# Patient Record
Sex: Female | Born: 1975 | Race: White | Hispanic: No | Marital: Married | Smoking: Former smoker
Health system: Southern US, Community
[De-identification: ages and names within clinical notes are randomized; demographics above are authoritative.]

## PROBLEM LIST (undated history)

## (undated) DIAGNOSIS — IMO0002 Reserved for concepts with insufficient information to code with codable children: Secondary | ICD-10-CM

## (undated) DIAGNOSIS — K6389 Other specified diseases of intestine: Secondary | ICD-10-CM

## (undated) DIAGNOSIS — J189 Pneumonia, unspecified organism: Secondary | ICD-10-CM

## (undated) DIAGNOSIS — E785 Hyperlipidemia, unspecified: Secondary | ICD-10-CM

## (undated) DIAGNOSIS — F419 Anxiety disorder, unspecified: Secondary | ICD-10-CM

## (undated) DIAGNOSIS — R140 Abdominal distension (gaseous): Principal | ICD-10-CM

## (undated) DIAGNOSIS — I10 Essential (primary) hypertension: Secondary | ICD-10-CM

## (undated) DIAGNOSIS — K219 Gastro-esophageal reflux disease without esophagitis: Secondary | ICD-10-CM

## (undated) DIAGNOSIS — R112 Nausea with vomiting, unspecified: Secondary | ICD-10-CM

## (undated) DIAGNOSIS — Z9889 Other specified postprocedural states: Secondary | ICD-10-CM

## (undated) HISTORY — PX: CHOLECYSTECTOMY: SHX55

## (undated) HISTORY — PX: HERNIA REPAIR: SHX51

## (undated) HISTORY — PX: TUBAL LIGATION: SHX77

## (undated) HISTORY — DX: Abdominal distension (gaseous): R14.0

## (undated) HISTORY — DX: Other specified diseases of intestine: K63.89

## (undated) HISTORY — DX: Hyperlipidemia, unspecified: E78.5

---

## 1998-07-21 ENCOUNTER — Inpatient Hospital Stay (HOSPITAL_COMMUNITY): Admission: AD | Admit: 1998-07-21 | Discharge: 1998-07-27 | Payer: Self-pay | Admitting: Obstetrics and Gynecology

## 1998-07-22 ENCOUNTER — Encounter: Payer: Self-pay | Admitting: Obstetrics and Gynecology

## 1998-07-29 ENCOUNTER — Encounter (HOSPITAL_COMMUNITY): Admission: RE | Admit: 1998-07-29 | Discharge: 1998-10-27 | Payer: Self-pay | Admitting: *Deleted

## 1998-08-20 ENCOUNTER — Other Ambulatory Visit: Admission: RE | Admit: 1998-08-20 | Discharge: 1998-08-20 | Payer: Self-pay | Admitting: Obstetrics and Gynecology

## 1999-11-08 ENCOUNTER — Inpatient Hospital Stay (HOSPITAL_COMMUNITY): Admission: AD | Admit: 1999-11-08 | Discharge: 1999-11-11 | Payer: Self-pay | Admitting: Obstetrics and Gynecology

## 1999-11-08 ENCOUNTER — Encounter (INDEPENDENT_AMBULATORY_CARE_PROVIDER_SITE_OTHER): Payer: Self-pay

## 1999-12-13 ENCOUNTER — Other Ambulatory Visit: Admission: RE | Admit: 1999-12-13 | Discharge: 1999-12-13 | Payer: Self-pay | Admitting: Obstetrics and Gynecology

## 2001-04-17 ENCOUNTER — Other Ambulatory Visit: Admission: RE | Admit: 2001-04-17 | Discharge: 2001-04-17 | Payer: Self-pay | Admitting: Obstetrics and Gynecology

## 2010-05-09 DIAGNOSIS — K6389 Other specified diseases of intestine: Secondary | ICD-10-CM

## 2010-05-09 DIAGNOSIS — R140 Abdominal distension (gaseous): Secondary | ICD-10-CM

## 2010-05-09 HISTORY — DX: Other specified diseases of intestine: K63.89

## 2010-05-09 HISTORY — DX: Abdominal distension (gaseous): R14.0

## 2010-05-19 ENCOUNTER — Other Ambulatory Visit
Admission: RE | Admit: 2010-05-19 | Discharge: 2010-05-19 | Payer: Self-pay | Source: Home / Self Care | Admitting: Obstetrics & Gynecology

## 2010-05-22 DIAGNOSIS — R197 Diarrhea, unspecified: Secondary | ICD-10-CM | POA: Insufficient documentation

## 2010-09-24 NOTE — Discharge Summary (Signed)
[facility]  Patient:    Alison [name], Alison [name]                     MRN: [id number] Adm. Date:  [id number] Disc. Date: [id number] Attending:  Osborn Coho Dictator:   Leilani Able, P.A.                           Discharge Summary  FINAL DIAGNOSES:              1. Intrauterine pregnancy at 37-1/2 weeks                                  estimated gestational age.                               2. Preeclampsia.                               3. History of prior cesarean section.                               4. Desire for permanent sterilization.  PROCEDURES:                   1. Repeat low transverse cesarean section.                               2. Bilateral tubal ligation using the Pomeroy                                  procedure.  SURGEON:                      Mark E. Dareen Piano, M.D.  ASSISTANTDebbe Bales A. Edward Jolly, M.D.  COMPLICATIONS:                None.  HISTORY OF PRESENT ILLNESS:   This 35 year old, G2, P1-0-0-1, presented around 37-1/2 weeks estimated gestational age for repeat cesarean section and bilateral tubal ligation.  The patient was scheduled to have elective repeat cesarean section in approximately one week, but today is scheduled secondary to preeclampsia.  The patients blood pressure upon admission was about 164/102.  She did have about 2+ protein in her urine.  At this point, she was in no apparent distress.  She was admitted to repeat cesarean section.  HOSPITAL COURSE:              She was taken to the operating room by [facility] E. Dareen Piano, M.D., where a repeat low transverse cesarean section was performed with the delivery of a 7 pound 7 ounce female infant with Apgars of 9 and 9. The delivery went without complications.  At this time, the patient still expressed her desire for permanent sterilization.  A bilateral tubal ligation was performed using the Pomeroy method.  The procedure went  without complications.  The patients postoperative course was benign without significant fevers.  Her blood pressures started to  return to normal.  She had been afebrile.  DISPOSITION:                  Felt ready for discharge on postoperative day #3.  DIET:                         The patient was sent home on a regular diet.  ACTIVITY:                     Told to decrease activities.  DISCHARGE MEDICATIONS:        Told to continue FESO4 325 mg one a day.  Does not need prenatal vitamins since the patient is bottle feeding.  She was given prescriptions for Percocet one to two every hours as needed for pain and for Motrin 600 mg one every six hours as needed for pain.  FOLLOW-UP:                    The patient is to follow up in the office on November 12, 1999, for her staple removal and then will follow up again in four weeks for her postoperative visit.  The patient was to call with any temperatures or problems with her incision.  LABORATORY DATA:              On discharge, the patients hemoglobin was 10.7 with a white blood cell count 6.7. DD:  12/03/99 TD:  12/06/99 Job: [id number]

## 2010-09-24 NOTE — Op Note (Signed)
[facility]  Patient:    Alison [name], Alison [name]                     MRN: [id number] Proc. Date: 11/08/99 Adm. Date:  [id number] Attending:  Osborn Coho                           Operative Report  PREOPERATIVE DIAGNOSIS:       Intrauterine pregnancy at 37-1/2 weeks estimated gestational age.  Preeclampsia.  History of prior cesarean section.  The patient desires permanent sterilization.  POSTOPERATIVE DIAGNOSIS:      Intrauterine pregnancy at 37-1/2 weeks estimated gestational age.  Preeclampsia.  History of prior cesarean section.  The patient desires permanent sterilization.  OPERATION:                    Repeat low transverse cesarean section with bilateral tubal ligation, Pomeroy procedure.  SURGEON:                      Mark E. Dareen Piano, M.D.  ASSISTANT:                    Conley Simmonds, M.D.  DRAINS:                       Foley to bedside drainage.  ANTIBIOTICS:                  Ancef 1 gram.  ANESTHESIA:                   Spinal.  ESTIMATED BLOOD LOSS:         900 cc.  SPECIMENS:                    Portions of right and left fallopian tubes sent to pathology.  DESCRIPTION OF PROCEDURE:     The patient was taken to the operating room where  spinal anesthetic was administered without complications.  She was then placed n the dorsal supine position with a left lateral tilt.  She was prepped with Hibiclens and Foley catheter was placed.  She was draped in the usual fashion for this procedure.  A Pfannenstiel incision was made through the previous scar. This was carried down to the fascia.  The fascia was entered in the midline and extended laterally with the Mayo scissors.  The rectus muscles were then sharply dissected from the fascia.  The rectus muscles were divided in the midline and taken superiorly and inferiorly.  The parietoperitoneum was entered sharply and taken  superiorly and inferiorly and the bladder flap was  taken down sharply.  A low transverse uterine incision was made in the midline and extended laterally with  blunt dissection.  Amniotic sac was entered with the knife and amniotic fluid was noted to be clear.  The infant was delivered in the vertex presentation.  The oropharynx and nostrils were bulb suctioned and then the remaining infant delivered. The cord was doubly clamped and cut and the infant handed to the awaiting NICU team.  Cord blood was then obtained.  The placenta was then manually removed.  The uterus was exteriorized.  The uterine cavity was wiped with a wet  lap.  The uterine incision was closed in a single layer of 0 chromic in a running locking fashion.  The bladder flap was not closed.  The right fallopian tubes was grasped in the isthmic portion with a Babcock.  A 2 cm portion of the tube was doubly ligated with 0 gut suture.  The knuckle was excised.  Both ostia were visualized.  Hemostasis appeared to be adequate.  A similar procedure was performed on the opposite side.  The abdominal cavity was then irrigated.  The uterus was  replaced back in the abdominal cavity.  The parietoperitoneum was closed using -0 chromic in a running fashion.  The fascia was closed using 0 Monocryl in a running fashion.  Subcuticular tissue was made hemostatic with the Bovie.  Stainless steel clips were used to close the skin. DD:  11/08/99 TD:  11/08/99 Job: [id number]

## 2010-11-16 ENCOUNTER — Ambulatory Visit (INDEPENDENT_AMBULATORY_CARE_PROVIDER_SITE_OTHER): Payer: Self-pay | Admitting: Gastroenterology

## 2010-11-16 ENCOUNTER — Encounter: Payer: Self-pay | Admitting: Gastroenterology

## 2010-11-16 ENCOUNTER — Encounter: Payer: BC Managed Care – PPO | Admitting: Gastroenterology

## 2010-11-16 VITALS — BP 135/92 | HR 84 | Temp 98.0°F | Ht 67.0 in | Wt 247.2 lb

## 2010-11-16 DIAGNOSIS — R140 Abdominal distension (gaseous): Secondary | ICD-10-CM | POA: Insufficient documentation

## 2010-11-16 DIAGNOSIS — R141 Gas pain: Secondary | ICD-10-CM

## 2010-11-16 DIAGNOSIS — R143 Flatulence: Secondary | ICD-10-CM

## 2010-11-16 NOTE — Progress Notes (Signed)
  Subjective:    Patient ID: Alison [name], female    DOB: 27-Jul-1975, 35 y.o.   MRN: [id number]  PCP: Tommi Rumps, [facility]  HPI Sx began JAN 2012. Had GB out APR 2012 for bloating, discomfort, and not associated with diarrhea or constipation. May go every 1-3 days. No nausea or vomiting. No AS, BC, Goody's or Aleve. Uses IBU 2x/week. No problems swallowing or weight loss. No milk. Eats cheese-2-3x/week. Ice cream: 1x/week. Since last Fall gained 20 lbs. Needs to get tubes tied and can't lose weight.  Saw Dr. Doylene Canning and said she did not have a hernia. Feels a bulge in mid-abd above belly button. Tried gas pills no better. Hasn't tried probiotics. In the AM, better but as day progresses gets worse. Take OMP if she eats a hamburger or something that triggers: burning in stomach.   Past Medical History  Diagnosis Date  . Bloating JAN 2012   Past Surgical History  Procedure Date  . Cesarean section X2  . Cholecystectomy 2012 [facility]    BILIARY DYSKINESIA   No Known Allergies  Current Outpatient Prescriptions  Medication Sig Dispense Refill  . omeprazole (PRILOSEC) 20 MG capsule Take 20 mg by mouth daily. occasionally        No family history on file.  History  Substance Use Topics  . Smoking status: Current Everyday Smoker    Types: Cigarettes  . Smokeless tobacco: Not on file   Comment: 10 cigarettes daily  . Alcohol Use: No        Review of Systems  All other systems reviewed and are negative.       Objective:   Physical Exam  Vitals reviewed. Constitutional: She is oriented to person, place, and time. She appears well-developed and well-nourished. No distress.  HENT:  Head: Normocephalic and atraumatic.  Mouth/Throat: No oropharyngeal exudate.  Eyes: Pupils are equal, round, and reactive to light. No scleral icterus.  Neck: Normal range of motion. Neck supple.  Cardiovascular: Normal rate, regular rhythm and normal heart sounds.   Pulmonary/Chest:  Effort normal and breath sounds normal. She has no wheezes.  Abdominal: Soft. Bowel sounds are normal. She exhibits distension (mild). There is no tenderness.  Musculoskeletal: She exhibits no edema.  Lymphadenopathy:    She has no cervical adenopathy.  Neurological: She is alert and oriented to person, place, and time.       No focal deficits  Skin: No rash noted.  Psychiatric: Her behavior is normal.          Assessment & Plan:

## 2010-11-16 NOTE — Patient Instructions (Signed)
AVOID FOOD THAT CAUSES BLOATING. TAKE [employer]S PROBIOTIC DAILY. Add Fibercon 2 po twice daily. GET BLOOD DRAWN to detect H. Pylori infection which may cause bloating. YOUR ABD BULGE IS DIASTASIS RECTI.  Go to ED for severe abd pain, fever, nausea,  Or vomiting. Follow up in 1 month.   Bloating Bloating is the feeling of fullness in your belly. You may feel as though your pants are too tight. Often the cause of bloating is overeating, retaining fluids, or having gas in your bowel. It is also caused by swallowing air and eating foods that cause gas. Irritable bowel syndrome is one of the most common causes of bloating. Constipation is also a common cause. Sometimes more serious problems can cause bloating.  SYMPTOMS Usually there is a feeling of fullness, as though your abdomen is bulged out. There may be mild discomfort.   DIAGNOSIS Usually no particular testing is necessary for most bloating. If the condition persists and seems to become worse, your caregiver may do additional testing.   TREATMENT  There is no direct treatment for bloating.   Do not put gas into the bowel. Avoid chewing gum and sucking on candy. These tend to make you swallow air. Swallowing air can also be a nervous habit. Try to avoid this.   Avoiding high residue diets will help. Eat foods with soluble fibers (examples include, root vegetables, apples, barley) and substitute dairy products with soy and rice products.   Avoid carbonated beverages.   Over the counter preparations are available that help reduce gas.   Document Released: 02/23/2006 Document Re-Released: 05/17/2009 [facility] Patient Information 2011 [place], [place].

## 2010-11-16 NOTE — Progress Notes (Signed)
error 

## 2010-11-16 NOTE — Assessment & Plan Note (Signed)
Sx likely functional. Differential diagnosis includes H. Pylori gastritis, or SIBO, less likley occult malignancy.  Avoid food causing bloating HO given. Stop drinking soda. Check H. Pylori serology. If neg, pt needs EGD w/ SMALL BOWEL Bx. If EGD neg, then pt needs CT and/or HBT for SIBO. Add FiberCon bid. Follow up in one mo. Continue low fat diet.

## 2010-11-17 LAB — H. PYLORI ANTIBODY, IGG: H Pylori IgG: 0.43 {ISR}

## 2010-11-17 NOTE — Progress Notes (Signed)
Cc to Arnette Felts at [employer]

## 2010-11-18 ENCOUNTER — Encounter: Payer: Self-pay | Admitting: General Practice

## 2010-11-23 NOTE — Progress Notes (Signed)
Pt has appt on 12/15/10

## 2010-11-23 NOTE — Progress Notes (Signed)
Ov 12/03/10

## 2010-11-30 ENCOUNTER — Telehealth: Payer: Self-pay

## 2010-11-30 ENCOUNTER — Ambulatory Visit (HOSPITAL_COMMUNITY)
Admission: RE | Admit: 2010-11-30 | Discharge: 2010-11-30 | Disposition: A | Discharge status: Home / Self Care | Payer: BC Managed Care – PPO | Source: Ambulatory Visit | Attending: Gastroenterology | Admitting: Gastroenterology

## 2010-11-30 ENCOUNTER — Other Ambulatory Visit: Payer: Self-pay | Admitting: Gastroenterology

## 2010-11-30 DIAGNOSIS — K429 Umbilical hernia without obstruction or gangrene: Secondary | ICD-10-CM | POA: Insufficient documentation

## 2010-11-30 DIAGNOSIS — R109 Unspecified abdominal pain: Secondary | ICD-10-CM | POA: Insufficient documentation

## 2010-11-30 MED ORDER — IOHEXOL 300 MG/ML  SOLN
100.0000 mL | Freq: Once | INTRAMUSCULAR | Status: AC | PRN
  Administered 2010-11-30: 100 mL via INTRAVENOUS

## 2010-11-30 NOTE — Telephone Encounter (Signed)
Called pt at home. Pt states she has a knot/bulge in her stomach and she can't sleep because she sleeps on her stomach. Saw Dr. Doylene Canning and not satisfied with her eval. Told her she did not have a hernia. No nausea or vomiting. Decrease po intake. No fever. Today STAT CT ABD AND PELVIS, W/ IV AND ORAL CONTRAST, dx: sever abd pain, evaluate for incarcerated hernia

## 2010-11-30 NOTE — Telephone Encounter (Signed)
Pt was informed. She is concerned that the knot is a hernia. She would like a call from Dr. Darrick Penna. She is aware she is on lunch now, and will be seeing pts when she first returns.

## 2010-11-30 NOTE — Telephone Encounter (Signed)
noted 

## 2010-11-30 NOTE — Telephone Encounter (Signed)
Please call pt. She most likely has IBS, which is causing her pain. If her only Sx is pain, then she should avoid dairy and hold the FiberCon. Increase the OMP to BID. Follow a full liquid diet until the pain resolves. Pt should pick up a diet HO. Will see her on JUL 27.

## 2010-11-30 NOTE — Telephone Encounter (Signed)
Pt called. Is scheduled for EGD on 12/03/2010. She is having abd pain. Has a knot in her stomach around belly button.  Said she can't sleep, can't get comfortable to rest. She said Dr. Darrick Penna mentioned doing a CT after EGD if it was negative. She wants to know if it would be done that day, and wants to know what she needs to do in the interim. Please advise!

## 2010-11-30 NOTE — Telephone Encounter (Signed)
Called at 2:25 pm and spoke with Johnsie and scheduled CT. She told me to have the pt come right on over and they are to page Dr. Darrick Penna when the CT is complete.

## 2010-12-01 ENCOUNTER — Telehealth: Payer: Self-pay | Admitting: Gastroenterology

## 2010-12-01 NOTE — Telephone Encounter (Signed)
Records faxed to Dr. Jenkins.

## 2010-12-01 NOTE — Telephone Encounter (Signed)
Spoke with pt 7/24 RE: CT SCAN-MOD [facility] HERNIA and pt has significant abd pain. Pt scheduled to see Dr. Lovell Sheehan 7/26 1045. Called pt and discussed size of hernia, and appt. Pt given directions, address, and phone number. Picked up Vicodin but didn;t use it because she "doesn't do well" with pain meds. Plan EGD on 7/27.  [facility] RECORDS AND CT REPORT TO DR. Lovell Sheehan' [facility].

## 2010-12-02 MED ORDER — SODIUM CHLORIDE 0.45 % IV SOLN
Freq: Once | INTRAVENOUS | Status: AC
  Administered 2010-12-03: 1000 mL via INTRAVENOUS

## 2010-12-02 NOTE — H&P (Signed)
Alison [name] is an 35 y.o. female.   Chief Complaint: Umbilical pain and swelling HPI: Has been having increasing periumbilical pain since laparoscopic cholecystectomy at [facility] in 4/12.  CT scan of the abdomen reveals an incisional hernia at umbilicus.  Past Medical History  Diagnosis Date  . Bloating JAN 2012    Past Surgical History  Procedure Date  . Cesarean section X2  . Cholecystectomy APR 2012 [facility]    BILIARY DYSKINESIA, path: CHRONIC CHOLECYSTITIS    No family history on file. Social History:  reports that she has been smoking Cigarettes.  She does not have any smokeless tobacco history on file. She reports that she does not drink alcohol. Her drug history not on file.  Allergies: No Known Allergies  No current facility-administered medications on file as of .   Medications Prior to Admission  Medication Sig Dispense Refill  . omeprazole (PRILOSEC) 20 MG capsule Take 20 mg by mouth daily. occasionally         No results found for this or any previous visit (from the past 48 hour(s)). No results found.  Review of Systems  Constitutional: Negative.   HENT: Negative.   Eyes: Negative.   Respiratory: Negative.   Cardiovascular: Negative.   Gastrointestinal: Positive for nausea and abdominal pain.  Genitourinary: Negative.   Musculoskeletal: Negative.   Skin: Negative.   Neurological: Negative.   Endo/Heme/Allergies: Negative.   Psychiatric/Behavioral: Negative.     Last menstrual period 11/17/2010. Physical Exam  Constitutional: She is oriented to person, place, and time. She appears well-developed and well-nourished.  HENT:  Head: Normocephalic and atraumatic.  Eyes: Pupils are equal, round, and reactive to light.  Neck: Normal range of motion.  Cardiovascular: Normal rate, regular rhythm and normal heart sounds.   Respiratory: Effort normal and breath sounds normal.  GI: Soft. Bowel sounds are normal.       Reducible supraumbilical  hernia at surgical site.  Neurological: She is alert and oriented to person, place, and time.  Skin: Skin is warm and dry.     Assessment/Plan Incisional hernia, supraumbilical trochar site  Scheduled for incisional herniorrhaphy with mesh on 12/13/10.  Risks and benefits of procedure including bleeding, infection, and recurrence of the hernia were fully explained to the patient, who gives informed consent.  Tesneem Dufrane A 12/02/2010, 12:32 PM

## 2010-12-03 ENCOUNTER — Ambulatory Visit (HOSPITAL_COMMUNITY)
Admission: RE | Admit: 2010-12-03 | Discharge: 2010-12-03 | Disposition: A | Discharge status: Home / Self Care | Payer: BC Managed Care – PPO | Source: Ambulatory Visit | Attending: Gastroenterology | Admitting: Gastroenterology

## 2010-12-03 ENCOUNTER — Other Ambulatory Visit: Payer: Self-pay | Admitting: Gastroenterology

## 2010-12-03 ENCOUNTER — Encounter: Payer: BC Managed Care – PPO | Admitting: Gastroenterology

## 2010-12-03 ENCOUNTER — Encounter (HOSPITAL_COMMUNITY): Payer: Self-pay | Admitting: *Deleted

## 2010-12-03 ENCOUNTER — Encounter (HOSPITAL_COMMUNITY)
Admission: RE | Disposition: A | Discharge status: Home / Self Care | Payer: Self-pay | Source: Ambulatory Visit | Attending: Gastroenterology

## 2010-12-03 DIAGNOSIS — K259 Gastric ulcer, unspecified as acute or chronic, without hemorrhage or perforation: Secondary | ICD-10-CM

## 2010-12-03 DIAGNOSIS — R142 Eructation: Secondary | ICD-10-CM | POA: Insufficient documentation

## 2010-12-03 DIAGNOSIS — R1013 Epigastric pain: Secondary | ICD-10-CM

## 2010-12-03 DIAGNOSIS — R141 Gas pain: Secondary | ICD-10-CM | POA: Insufficient documentation

## 2010-12-03 DIAGNOSIS — IMO0002 Reserved for concepts with insufficient information to code with codable children: Secondary | ICD-10-CM

## 2010-12-03 DIAGNOSIS — K3189 Other diseases of stomach and duodenum: Secondary | ICD-10-CM

## 2010-12-03 HISTORY — DX: Reserved for concepts with insufficient information to code with codable children: IMO0002

## 2010-12-03 HISTORY — PX: ESOPHAGOGASTRODUODENOSCOPY: SHX5428

## 2010-12-03 SURGERY — EGD (ESOPHAGOGASTRODUODENOSCOPY)
Anesthesia: Moderate Sedation

## 2010-12-03 MED ORDER — MIDAZOLAM HCL 5 MG/5ML IJ SOLN
INTRAMUSCULAR | Status: DC | PRN
  Administered 2010-12-03 (×3): 2 mg via INTRAVENOUS

## 2010-12-03 MED ORDER — PROMETHAZINE HCL 25 MG/ML IJ SOLN
INTRAMUSCULAR | Status: AC
  Filled 2010-12-03: qty 1

## 2010-12-03 MED ORDER — MIDAZOLAM HCL 5 MG/5ML IJ SOLN
INTRAMUSCULAR | Status: AC
  Filled 2010-12-03: qty 10

## 2010-12-03 MED ORDER — MEPERIDINE HCL 100 MG/ML IJ SOLN
INTRAMUSCULAR | Status: AC
  Filled 2010-12-03: qty 2

## 2010-12-03 MED ORDER — SODIUM CHLORIDE 0.9 % IJ SOLN
INTRAMUSCULAR | Status: AC
  Filled 2010-12-03: qty 10

## 2010-12-03 MED ORDER — BUTAMBEN-TETRACAINE-BENZOCAINE 2-2-14 % EX AERO
INHALATION_SPRAY | CUTANEOUS | Status: DC | PRN
  Administered 2010-12-03: 2 via TOPICAL

## 2010-12-03 MED ORDER — STERILE WATER FOR IRRIGATION IR SOLN
Status: DC | PRN
  Administered 2010-12-03: 12:00:00

## 2010-12-03 MED ORDER — OMEPRAZOLE 20 MG PO CPDR: DELAYED_RELEASE_CAPSULE | ORAL | Status: DC

## 2010-12-03 MED ORDER — MEPERIDINE HCL 100 MG/ML IJ SOLN
INTRAMUSCULAR | Status: DC | PRN
  Administered 2010-12-03: 50 mg via INTRAVENOUS
  Administered 2010-12-03: 25 mg via INTRAVENOUS
  Administered 2010-12-03: 50 mg via INTRAVENOUS

## 2010-12-03 NOTE — Interval H&P Note (Signed)
History and Physical Interval Note:   12/03/2010   11:06 AM   Alison [name]  has presented today for surgery, with the diagnosis of dyspepsia  The various methods of treatment have been discussed with the patient and family. After consideration of risks, benefits and other options for treatment, the patient has consented to  Procedure(s): ESOPHAGOGASTRODUODENOSCOPY (EGD) as a surgical intervention .  I have reviewed the patients' chart and labs.  Questions were answered to the patient's satisfaction.     Jonette Eva  MD

## 2010-12-03 NOTE — H&P (Signed)
Reason for Visit     Bloated        Current Vitals       Recorded User        11/16/2010  4:17 PM  Doris Colin Benton, LPN           BP Pulse Temp (Src) Resp Ht Wt    135/92  84  98 F (36.7 C) (Tympanic)  N/A  5\' 7"  (1.702 m)  247 lb 3.2 oz (112.129 kg)       BMI SpO2 PF LMP    38.72 kg/m2  N/A  N/A  10/21/2010          Progress Notes     Jonette Eva, MD  11/16/2010  5:13 PM  Signed    Subjective:      Patient ID: Alison [name], Alison [name]    DOB: 1976/03/31, 35 y.o.   MRN: [id number]   PCP: Tommi Rumps, [facility]   HPI Sx began JAN 2012. Had GB out APR 2012 for bloating, discomfort, and not associated with diarrhea or constipation. May go every 1-3 days. No nausea or vomiting. No AS, BC, Goody's or Aleve. Uses IBU 2x/week. No problems swallowing or weight loss. No milk. Eats cheese-2-3x/week. Ice cream: 1x/week. Since last Fall gained 20 lbs. Needs to get tubes tied and can't lose weight.  Saw Dr. Doylene Canning and said she did not have a hernia. Feels a bulge in mid-abd above belly button. Tried gas pills no better. Hasn't tried probiotics. In the AM, better but as day progresses gets worse. Take OMP if she eats a hamburger or something that triggers: burning in stomach.     Past Medical History   Diagnosis  Date   .  Bloating  JAN 2012    Past Surgical History   Procedure  Date   .  Cesarean section  X2   .  Cholecystectomy  2012 [facility]       BILIARY DYSKINESIA    No Known Allergies    Current Outpatient Prescriptions   Medication  Sig  Dispense  Refill   .  omeprazole (PRILOSEC) 20 MG capsule  Take 20 mg by mouth daily. occasionally             No family history on file.    History   Substance Use Topics   .  Smoking status:  Current Everyday Smoker       Types:  Cigarettes   .  Smokeless tobacco:  Not on file     Comment: 10 cigarettes daily   .  Alcohol Use:  No              Review of Systems  All other systems reviewed and  are negative.     Objective:    Physical Exam  Vitals reviewed. Constitutional: She is oriented to person, place, and time. She appears well-developed and well-nourished. No distress.  HENT:   Head: Normocephalic and atraumatic.   Mouth/Throat: No oropharyngeal exudate.  Eyes: Pupils are equal, round, and reactive to light. No scleral icterus.  Neck: Normal range of motion. Neck supple.  Cardiovascular: Normal rate, regular rhythm and normal heart sounds.   Pulmonary/Chest: Effort normal and breath sounds normal. She has no wheezes.  Abdominal: Soft. Bowel sounds are normal. She exhibits distension (mild). There is no tenderness.  Musculoskeletal: She exhibits no edema.  Lymphadenopathy:    She has no cervical adenopathy.  Neurological: She is alert and  oriented to person, place, and time.       No focal deficits  Skin: No rash noted.  Psychiatric: Her behavior is normal.           Assessment & Plan:    Bloating - Jonette Eva, MD  11/16/2010  5:12 PM  Signed Sx likely functional. Differential diagnosis includes H. Pylori gastritis, or SIBO, less likley occult malignancy. Avoid food causing bloating HO given. Stop drinking soda. Check H. Pylori serology. If neg, pt needs EGD w/ SMALL BOWEL Bx. If EGD neg, then pt needs CT and/or HBT for SIBO. Add FiberCon bid. Follow up in one mo. Continue low fat diet.

## 2010-12-05 ENCOUNTER — Emergency Department (HOSPITAL_COMMUNITY)
Admission: EM | Admit: 2010-12-05 | Discharge: 2010-12-05 | Disposition: A | Discharge status: Home / Self Care | Payer: BC Managed Care – PPO | Attending: Emergency Medicine | Admitting: Emergency Medicine

## 2010-12-05 ENCOUNTER — Encounter (HOSPITAL_COMMUNITY): Payer: Self-pay | Admitting: Emergency Medicine

## 2010-12-05 ENCOUNTER — Emergency Department (HOSPITAL_COMMUNITY): Payer: BC Managed Care – PPO

## 2010-12-05 DIAGNOSIS — R141 Gas pain: Secondary | ICD-10-CM | POA: Insufficient documentation

## 2010-12-05 DIAGNOSIS — M549 Dorsalgia, unspecified: Secondary | ICD-10-CM | POA: Insufficient documentation

## 2010-12-05 DIAGNOSIS — R142 Eructation: Secondary | ICD-10-CM | POA: Insufficient documentation

## 2010-12-05 DIAGNOSIS — R143 Flatulence: Secondary | ICD-10-CM | POA: Insufficient documentation

## 2010-12-05 DIAGNOSIS — F17200 Nicotine dependence, unspecified, uncomplicated: Secondary | ICD-10-CM | POA: Insufficient documentation

## 2010-12-05 DIAGNOSIS — R109 Unspecified abdominal pain: Secondary | ICD-10-CM | POA: Insufficient documentation

## 2010-12-05 HISTORY — DX: Reserved for concepts with insufficient information to code with codable children: IMO0002

## 2010-12-05 LAB — COMPREHENSIVE METABOLIC PANEL
ALT: 28 U/L (ref 0–35)
AST: 19 U/L (ref 0–37)
Albumin: 3.7 g/dL (ref 3.5–5.2)
Calcium: 9.4 mg/dL (ref 8.4–10.5)
GFR calc Af Amer: >60 mL/min (ref >60)
Sodium: 140 mEq/L (ref 135–145)
Total Protein: 7.3 g/dL (ref 6.0–8.3)

## 2010-12-05 LAB — DIFFERENTIAL
Basophils Absolute: 0 10*3/uL (ref 0.0–0.1)
Eosinophils Absolute: 0.2 10*3/uL (ref 0.0–0.7)
Eosinophils Relative: 4 % (ref 0–5)
Neutrophils Relative %: 64 % (ref 43–77)

## 2010-12-05 LAB — CBC
MCH: 31.7 pg (ref 26.0–34.0)
MCV: 92.2 fL (ref 78.0–100.0)
Platelets: 191 10*3/uL (ref 150–400)
RDW: 13.4 % (ref 11.5–15.5)
WBC: 4.8 10*3/uL (ref 4.0–10.5)

## 2010-12-05 MED ORDER — FAMOTIDINE 20 MG PO TABS: 20.0000 mg | ORAL_TABLET | Freq: Two times a day (BID) | ORAL | Status: DC

## 2010-12-05 MED ORDER — DICYCLOMINE HCL 20 MG PO TABS: 20.0000 mg | ORAL_TABLET | Freq: Two times a day (BID) | ORAL | Status: DC

## 2010-12-05 MED ORDER — DICYCLOMINE HCL 10 MG PO CAPS
10.0000 mg | ORAL_CAPSULE | Freq: Once | ORAL | Status: AC
  Administered 2010-12-05: 10 mg via ORAL
  Filled 2010-12-05 (×2): qty 1

## 2010-12-05 MED ORDER — FAMOTIDINE 20 MG PO TABS
40.0000 mg | ORAL_TABLET | Freq: Once | ORAL | Status: AC
  Administered 2010-12-05: 40 mg via ORAL
  Filled 2010-12-05: qty 2

## 2010-12-05 NOTE — ED Notes (Signed)
Pt sitting on bed, no acute distress noted. Airway patent. Pt reports cramping better. Pain scale 1/10. Husband at bedside.

## 2010-12-05 NOTE — ED Provider Notes (Signed)
Scribed for Dr. Brooke Dare, the patient was seen in room 10. This chart was scribed by Jannette Fogo. This patient's care was started at 08:25AM.   Chief Complaint  Patient presents with  . Abdominal Cramping    HPI Alison [name] is a 35 y.o. female who presents to the Emergency Department complaining of 3 days of upper abdominal cramping status post esophagogastroduodenoscopy. Patient has had abdominal discomfort with bloating for >3.5 months. In April 2012 she had laparoscopic cholecystectomy which caused her to develop a hernia per patient, and since discomfort has not resolved. She was scheduled for a EGD on 12/03/10 and after the procedure developed upper abdominal cramping which she has not had before. Patient states she was diagnosed with "12 stomach ulcers" at that time and has been treated with Prilosec but the H.Pylori screen was negative. Patient describes the abdominal pain "like contractions" and it radiates to the back. States pain is exacerbated by eating and laying supine. She denies any associated fever, chills, nausea, vomiting, diarrhea, or constipation. Has had normal bowel movements without hematochezia. She denies a history of pancreatic problems. Had a CT Abdomen / Pelvis on 11/30/2010 which showed moderate sized umbilical hernia containing fat but no other acute findings were noted. There are no other associated symptoms and no other alleviating or aggravating factors.     Past Medical History  Diagnosis Date  . Bloating JAN 2012  . Umbilical hernia   . Ulcer     Past Surgical History  Procedure Date  . Cesarean section X2  . Cholecystectomy APR 2012 [facility]    BILIARY DYSKINESIA, path: CHRONIC CHOLECYSTITIS  . Tubal ligation   . Esophagogastroduodenoscopy     MEDICATIONS:  Previous Medications   ACETAMINOPHEN (TYLENOL) 500 MG TABLET    Take 1,000 mg by mouth daily as needed. For pain    HYDROCODONE-ACETAMINOPHEN (NORCO) 5-325 MG PER TABLET    Take 0.5-1 tablets by  mouth daily as needed. For pain   MULTIVITAMIN (THERAGRAN) TABLET    Take 1 tablet by mouth daily.     OMEPRAZOLE (PRILOSEC) 20 MG CAPSULE    Take 20 mg by mouth 2 (two) times daily. 1 PO BID    POLYCARBOPHIL (FIBERCON) 625 MG TABLET    Take 625 mg by mouth 2 (two) times daily.     PROBIOTIC PRODUCT (MISC INTESTINAL FLORA REGULAT) CHEW    Chew 1 tablet by mouth at bedtime.    SIMETHICONE 125 MG CAPS    Take 2 capsules by mouth 2 (two) times daily.     THERAPEUTIC MULTIVITAMIN-MINERALS (THERAGRAN-M) TABLET    Take 1 tablet by mouth daily.       ALLERGIES:  Allergies as of 12/05/2010  . (No Known Allergies)     Family History  Problem Relation Age of Onset  . Hyperlipidemia Mother     History  Substance Use Topics  . Smoking status: Current Everyday Smoker -- 0.5 packs/day for 10 years    Types: Cigarettes  . Smokeless tobacco: Never Used   Comment: 10 cigarettes daily  . Alcohol Use: No  Accompanied to the ED by spouse.    Review of Systems  Constitutional: Negative.  Negative for fever, chills and fatigue.  HENT: Negative.  Negative for congestion.   Respiratory: Negative.  Negative for cough.   Cardiovascular: Negative.  Negative for chest pain.  Gastrointestinal: Positive for abdominal pain and abdominal distention. Negative for nausea, vomiting, diarrhea, constipation and blood in stool.  Genitourinary: Negative for  dysuria, frequency, hematuria and difficulty urinating.  Musculoskeletal: Positive for back pain.  Skin: Negative.   Neurological: Negative.     Physical Exam  BP 152/88  Pulse 58  Temp(Src) 98.2 F (36.8 C) (Oral)  Resp 18  Ht 5\' 7"  (1.702 m)  Wt 243 lb (110.224 kg)  BMI 38.06 kg/m2  SpO2 100%  LMP 11/17/2010  Physical Exam  Constitutional: She is oriented to person, place, and time. She appears well-developed and well-nourished. No distress.  HENT:  Head: Normocephalic and atraumatic.  Mouth/Throat: Oropharynx is clear and moist.  Eyes:  Conjunctivae are normal. Pupils are equal, round, and reactive to light.  Neck: Normal range of motion. Neck supple.  Cardiovascular: Normal rate, regular rhythm and normal heart sounds.   Pulmonary/Chest: Effort normal and breath sounds normal.  Abdominal: Soft. Bowel sounds are normal. She exhibits no distension. There is no tenderness. There is no rebound and no guarding.       Obese.   Musculoskeletal: Normal range of motion. She exhibits no edema and no tenderness.  Neurological: She is alert and oriented to person, place, and time. Coordination normal.  Skin: Skin is warm and dry. No rash noted.  Psychiatric: She has a normal mood and affect.    OTHER DATA REVIEWED: Nursing notes, vital signs, and past medical records reviewed. Prior records reviewed and indicate: 11/30/2010 - CT Abdomen / Pelvis with Contrast: Interpreted by Radiologist Dr. Aubery Lapping. [place] Moderate sized umbilical hernia containing fat.  Normal appendix.  No acute findings in the abdomen or pelvis.  11/16/10 - H.Pylori screen negative at 0.43   DIAGNOSTIC STUDIES: Oxygen Saturation is 100% on room air, normal by my interpretation.     LABS / RADIOLOGY:  Results for orders placed during the hospital encounter of 12/05/10  CBC      Component Value Range   WBC 4.8  4.0 - 10.5 (K/uL)   RBC 4.23  3.87 - 5.11 (MIL/uL)   Hemoglobin 13.4  12.0 - 15.0 (g/dL)   HCT 40.9  81.1 - 91.4 (%)   MCV 92.2  78.0 - 100.0 (fL)   MCH 31.7  26.0 - 34.0 (pg)   MCHC 34.4  30.0 - 36.0 (g/dL)   RDW 78.2  95.6 - 21.3 (%)   Platelets 191  150 - 400 (K/uL)  DIFFERENTIAL      Component Value Range   Neutrophils Relative 64  43 - 77 (%)   Neutro Abs 3.1  1.7 - 7.7 (K/uL)   Lymphocytes Relative 24  12 - 46 (%)   Lymphs Abs 1.2  0.7 - 4.0 (K/uL)   Monocytes Relative 7  3 - 12 (%)   Monocytes Absolute 0.3  0.1 - 1.0 (K/uL)   Eosinophils Relative 4  0 - 5 (%)   Eosinophils Absolute 0.2  0.0 - 0.7 (K/uL)   Basophils Relative 1  0 - 1  (%)   Basophils Absolute 0.0  0.0 - 0.1 (K/uL)  COMPREHENSIVE METABOLIC PANEL      Component Value Range   Sodium 140  135 - 145 (mEq/L)   Potassium 3.7  3.5 - 5.1 (mEq/L)   Chloride 108  96 - 112 (mEq/L)   CO2 16 (*) 19 - 32 (mEq/L)   Glucose, Bld 125 (*) 70 - 99 (mg/dL)   BUN 6  6 - 23 (mg/dL)   Creatinine, Ser 0.86  0.50 - 1.10 (mg/dL)   Calcium 9.4  8.4 - 57.8 (mg/dL)   Total Protein  7.3  6.0 - 8.3 (g/dL)   Albumin 3.7  3.5 - 5.2 (g/dL)   AST 19  0 - 37 (U/L)   ALT 28  0 - 35 (U/L)   Alkaline Phosphatase 78  39 - 117 (U/L)   Total Bilirubin 0.2 (*) 0.3 - 1.2 (mg/dL)   GFR calc non Af Amer >60  >60 (mL/min)   GFR calc Af Amer >60  >60 (mL/min)  LIPASE, BLOOD      Component Value Range   Lipase 25  11 - 59 (U/L)    Abdomen XRAY with Chest: 3 View; Interpreted by Radiologist Dr. Rosealee Albee and reviewed by me: 1. No active cardiopulmonary abnormalities. 2. Nonobstructive bowel gas pattern.   ED COURSE / COORDINATION OF CARE: 10:40 - Re-examined by ED physician, patient stable for discharge.    MDM: Differential Diagnosis: Abdominal cramping versus gastritis. Laboratory studies including CBC, metabolic panel, lipase were performed and reviewed. These eyes were unremarkable. Patient was asymptomatic on arrival to emergency department and remained so. Pepcid and Bentyl were administered for symptom control. She'll be discharged home with Pepcid for further acid production and Bentyl for her spasmodic type pain. She is instructed to follow up with her primary care physician in one week. Acute abdominal series was performed due to her recent endoscopy to rule out free air. This was negative. This time the patient is safe for discharge. She is provided the signs and symptoms for return to the emergency department.  I personally performed the services described in this documentation, which was scribed in my presence. The recorded information has been reviewed and  considered.    IMPRESSION: Diagnoses that have been ruled out:  Diagnoses that are still under consideration:  Final diagnoses:  Abdominal cramping     PLAN: Discharge  The patient is to return the emergency department if there is any worsening of symptoms. I have reviewed the discharge instructions with the patient.    CONDITION ON DISCHARGE: Stable    MEDICATIONS GIVEN IN THE E.D.  Medications  polycarbophil (FIBERCON) 625 MG tablet (not administered)  Simethicone 125 MG CAPS (not administered)  acetaminophen (TYLENOL) 500 MG tablet (not administered)  multivitamin (THERAGRAN) tablet (not administered)  famotidine (PEPCID) 20 MG tablet (not administered)  dicyclomine (BENTYL) 20 MG tablet (not administered)  famotidine (PEPCID) tablet 40 mg (40 mg Oral Given 12/05/10 0845)  dicyclomine (BENTYL) capsule 10 mg (10 mg Oral Given 12/05/10 0845)     DISCHARGE MEDICATIONS: New Prescriptions   DICYCLOMINE (BENTYL) 20 MG TABLET    Take 1 tablet (20 mg total) by mouth 2 (two) times daily.   FAMOTIDINE (PEPCID) 20 MG TABLET    Take 1 tablet (20 mg total) by mouth 2 (two) times daily.     Procedures        Dayton Bailiff, MD 12/05/10 1046

## 2010-12-05 NOTE — ED Notes (Signed)
Pt c/o abdominal cramping beginning early am on Sat. EGD done on Fri. Pt reports several stomach ulcers. Scheduled for umbilical hernia repair on Aug. 6. No nausea or vomiting. No diarrhea. Not cramping at present. Triggered by eating and occur when sleeping.

## 2010-12-06 ENCOUNTER — Telehealth: Payer: Self-pay

## 2010-12-06 NOTE — Progress Notes (Signed)
error 

## 2010-12-06 NOTE — Telephone Encounter (Signed)
Pt called and said she had EGD on Fri and she has stomach ulcers. She is having a lot of cramps and especially in the night. She went to the ED yesterday and was told to take Pepcid bid and Bentyl for cramps bid in addition to the Prilosec bid that Dr. Darrick Penna had told her to take. She wants to let Dr. Darrick Penna know and find out if this is what she recommends. [place] Spaniel it is not doing any good anyway and she wants to know if there is something else that needs to be done. Please advise!

## 2010-12-06 NOTE — Telephone Encounter (Signed)
Pt informed

## 2010-12-06 NOTE — Telephone Encounter (Signed)
Please call Alison [name]. She has ulcers and nothing more an be done. She will heal. Continue Pepcid for 2-3 weeks and Prilosec. She will start to feel better in the next 7-10 days. Will call her when her Bx come back.

## 2010-12-07 ENCOUNTER — Encounter (HOSPITAL_COMMUNITY)
Admission: RE | Admit: 2010-12-07 | Discharge: 2010-12-07 | Disposition: A | Discharge status: Home / Self Care | Payer: BC Managed Care – PPO | Source: Ambulatory Visit | Attending: General Surgery | Admitting: General Surgery

## 2010-12-07 ENCOUNTER — Encounter (HOSPITAL_COMMUNITY): Payer: Self-pay

## 2010-12-07 LAB — SURGICAL PCR SCREEN
MRSA, PCR: NEGATIVE
Staphylococcus aureus: NEGATIVE

## 2010-12-07 NOTE — Patient Instructions (Addendum)
General Anesthetic, Adult A nurse specialized in giving anesthesia (anesthetist) or a doctor specialized in giving anesthesia (anesthesiologist) gave you a medicine that made you sleep while a procedure was performed. For as long as 24 hours following this procedure, you may feel:  Dizzy.   Weak.   Drowsy.  BEFORE YOUR ANESTHETIC OR SURGERY  You should be present one before your procedure or as directed by your caregiver. Check in at the admissions desk to fill out any necessary forms if you are not pre-registered. There may be consent forms or other paperwork to sign before the procedure.   There is a waiting area for your family while you are having your procedure.  LET YOUR CAREGIVERS KNOW ABOUT THE FOLLOWING  Allergies.  Medications taken including herbs, eye drops, over the counter medications, dietary supplements, and creams.   Use of steroids (by mouth or creams).   Previous problems with anesthetics or novocaine.   Use of cigarettes, alcohol, or illicit drugs.   Possibility of pregnancy, if this applies.  History of blood clots (DVTs, pulmonary embolism).   History of bleeding or blood problems.   Previous surgery.   Family history (especially anesthetic problems).   Other health problems.  1.   1.  AFTER YOUR SURGERY 1. After surgery, you will be taken to the recovery area where a nurse will monitor your progress. You will be allowed to go home when you are awake, stable, taking fluids well, and without complications.  2.  3. For the first 24 hours following an anesthetic:   Have a responsible person with you.   Do not drive a car. If you are alone, do not take public transportation.   Do not drink alcohol.   Do not take medicine that has not been prescribed by your caregiver.   Do not sign important papers or make important decisions.   You may resume normal diet and activities as directed.   Change bandages (dressings) as directed.   Only take  over-the-counter or prescription medicines for pain, discomfort, and or fever, as directed by your caregiver.  If you have questions or problems that seem related to the anesthetic, call the hospital and ask for the anesthetist or anesthesiologist on call. FINDING OUT THE RESULTS OF YOUR TEST Not all test results are available during your visit. If your test results are not back during the visit, make an appointment with your caregiver to find out the results. Do not assume everything is normal if you have not heard from your caregiver or the medical facility. It is important for you to follow up on all of your test results. SEEK IMMEDIATE MEDICAL CARE IF:  You develop a rash.   You have difficulty breathing.   You have chest pain.   You have allergic problems.  Document Released: 08/02/2007 Document Re-Released: 07/20/2009 [facility] Patient Information 2011 [place], [place].20 Alison [name]  12/07/2010   Your procedure is scheduled on:  12/13/2010  Report to [facility] at 0930 AM.  Call this number if you have problems the morning of surgery: [phone]   Remember:   Do not eat food:After Midnight.  Do not drink clear liquids: After Midnight.  Take these medicines the morning of surgery with A SIP OF WATER: prilosec, hydrocodone if needed   Do not wear jewelry, make-up or nail polish.  Do not wear lotions, powders, or perfumes. You may wear deodorant.  Do not shave 48 hours prior to surgery.  Do not bring valuables  to the hospital.  Contacts, dentures or bridgework may not be worn into surgery.  Leave suitcase in the car. After surgery it may be brought to your room.  For patients admitted to the hospital, checkout time is 11:00 AM the day of discharge.   Patients discharged the day of surgery will not be allowed to drive home.  Name and phone number of your driver: driver Alison [name]  Special Instructions: CHG Shower Use Special Wash: 1/2 bottle night before surgery and 1/2 bottle  morning of surgery.   Please read over the following fact sheets that you were given: Surgical Site Infection Prevention, Anesthesia Post-op Instructions and Care and Recovery After Surgery

## 2010-12-09 ENCOUNTER — Telehealth: Payer: Self-pay | Admitting: Gastroenterology

## 2010-12-09 NOTE — Telephone Encounter (Signed)
Please call pt. She has ulcer 2o to NSAID use. Avoid NSAIDs. OMP BID. Schedule for HBT for SIBO to complete workup for diarrhea.

## 2010-12-09 NOTE — Telephone Encounter (Signed)
Pt informed. She is schedued for hernia repair on 12/13/2010. She just wants to know how long she needs to wait after that or does it matter as long as she feels up to it?

## 2010-12-09 NOTE — Telephone Encounter (Signed)
Pt aware she should wait 4 weeks after her hernia repair to do the HBT.

## 2010-12-09 NOTE — Telephone Encounter (Signed)
Pt should 4 weeks after hernia repair for HBT.

## 2010-12-10 ENCOUNTER — Encounter (HOSPITAL_COMMUNITY): Payer: Self-pay | Admitting: Gastroenterology

## 2010-12-13 ENCOUNTER — Encounter (HOSPITAL_COMMUNITY)
Admission: RE | Disposition: A | Discharge status: Home / Self Care | Payer: Self-pay | Source: Ambulatory Visit | Attending: General Surgery

## 2010-12-13 ENCOUNTER — Telehealth: Payer: Self-pay | Admitting: Gastroenterology

## 2010-12-13 ENCOUNTER — Encounter (HOSPITAL_COMMUNITY): Payer: Self-pay | Admitting: Anesthesiology

## 2010-12-13 ENCOUNTER — Ambulatory Visit (HOSPITAL_COMMUNITY)
Admission: RE | Admit: 2010-12-13 | Discharge: 2010-12-13 | Disposition: A | Discharge status: Home / Self Care | Payer: BC Managed Care – PPO | Source: Ambulatory Visit | Attending: General Surgery | Admitting: General Surgery

## 2010-12-13 ENCOUNTER — Ambulatory Visit (HOSPITAL_COMMUNITY): Payer: BC Managed Care – PPO | Admitting: Anesthesiology

## 2010-12-13 ENCOUNTER — Encounter (HOSPITAL_COMMUNITY): Payer: Self-pay

## 2010-12-13 DIAGNOSIS — K432 Incisional hernia without obstruction or gangrene: Secondary | ICD-10-CM | POA: Insufficient documentation

## 2010-12-13 DIAGNOSIS — Z4889 Encounter for other specified surgical aftercare: Secondary | ICD-10-CM

## 2010-12-13 HISTORY — PX: INCISIONAL HERNIA REPAIR: SHX193

## 2010-12-13 SURGERY — REPAIR, HERNIA, INCISIONAL
Anesthesia: General | Wound class: Clean

## 2010-12-13 MED ORDER — ONDANSETRON HCL 4 MG/2ML IJ SOLN
4.0000 mg | Freq: Once | INTRAMUSCULAR | Status: AC
  Administered 2010-12-13: 4 mg via INTRAVENOUS

## 2010-12-13 MED ORDER — MIDAZOLAM HCL 5 MG/5ML IJ SOLN
INTRAMUSCULAR | Status: DC | PRN
  Administered 2010-12-13: 2 mg via INTRAVENOUS

## 2010-12-13 MED ORDER — ONDANSETRON HCL 4 MG/2ML IJ SOLN: 4.0000 mg | Freq: Once | INTRAMUSCULAR | Status: DC | PRN

## 2010-12-13 MED ORDER — PROPOFOL 10 MG/ML IV EMUL
INTRAVENOUS | Status: AC
  Filled 2010-12-13: qty 20

## 2010-12-13 MED ORDER — GLYCOPYRROLATE 0.2 MG/ML IJ SOLN
INTRAMUSCULAR | Status: DC | PRN
  Administered 2010-12-13: .4 mg via INTRAVENOUS

## 2010-12-13 MED ORDER — LACTATED RINGERS IV SOLN
INTRAVENOUS | Status: DC | PRN
  Administered 2010-12-13: 10:00:00 via INTRAVENOUS

## 2010-12-13 MED ORDER — LACTATED RINGERS IV SOLN
INTRAVENOUS | Status: DC
  Administered 2010-12-13: 10:00:00 via INTRAVENOUS

## 2010-12-13 MED ORDER — MIDAZOLAM HCL 2 MG/2ML IJ SOLN
INTRAMUSCULAR | Status: AC
  Administered 2010-12-13: 2 mg via INTRAVENOUS
  Filled 2010-12-13: qty 2

## 2010-12-13 MED ORDER — ENOXAPARIN SODIUM 40 MG/0.4ML ~~LOC~~ SOLN
40.0000 mg | Freq: Once | SUBCUTANEOUS | Status: AC
  Administered 2010-12-13: 40 mg via SUBCUTANEOUS

## 2010-12-13 MED ORDER — FENTANYL CITRATE 0.05 MG/ML IJ SOLN
25.0000 ug | INTRAMUSCULAR | Status: DC | PRN
  Administered 2010-12-13 (×4): 50 ug via INTRAVENOUS

## 2010-12-13 MED ORDER — BUPIVACAINE HCL (PF) 0.5 % IJ SOLN
INTRAMUSCULAR | Status: DC | PRN
  Administered 2010-12-13: 10 mL

## 2010-12-13 MED ORDER — KETOROLAC TROMETHAMINE 30 MG/ML IJ SOLN
INTRAMUSCULAR | Status: AC
  Filled 2010-12-13: qty 1

## 2010-12-13 MED ORDER — MIDAZOLAM HCL 2 MG/2ML IJ SOLN
1.0000 mg | INTRAMUSCULAR | Status: DC | PRN
  Administered 2010-12-13: 2 mg via INTRAVENOUS

## 2010-12-13 MED ORDER — SODIUM CHLORIDE 0.9 % IR SOLN
Status: DC | PRN
  Administered 2010-12-13: 1000 mL

## 2010-12-13 MED ORDER — HYDROCODONE-ACETAMINOPHEN 5-325 MG PO TABS: ORAL_TABLET | ORAL | Status: DC

## 2010-12-13 MED ORDER — GLYCOPYRROLATE 0.2 MG/ML IJ SOLN
INTRAMUSCULAR | Status: AC
  Filled 2010-12-13: qty 1

## 2010-12-13 MED ORDER — ROCURONIUM BROMIDE 100 MG/10ML IV SOLN
INTRAVENOUS | Status: DC | PRN
  Administered 2010-12-13: 35 mg via INTRAVENOUS

## 2010-12-13 MED ORDER — BUPIVACAINE HCL (PF) 0.5 % IJ SOLN
INTRAMUSCULAR | Status: AC
  Filled 2010-12-13: qty 30

## 2010-12-13 MED ORDER — FENTANYL CITRATE 0.05 MG/ML IJ SOLN
INTRAMUSCULAR | Status: AC
  Administered 2010-12-13: 50 ug via INTRAVENOUS
  Filled 2010-12-13: qty 2

## 2010-12-13 MED ORDER — CEFAZOLIN SODIUM-DEXTROSE 2-3 GM-% IV SOLR: 2.0000 g | INTRAVENOUS | Status: DC

## 2010-12-13 MED ORDER — SUCCINYLCHOLINE CHLORIDE 20 MG/ML IJ SOLN
INTRAMUSCULAR | Status: AC
  Filled 2010-12-13: qty 1

## 2010-12-13 MED ORDER — PROPOFOL 10 MG/ML IV EMUL
INTRAVENOUS | Status: DC | PRN
  Administered 2010-12-13: 150 mg via INTRAVENOUS

## 2010-12-13 MED ORDER — NEOSTIGMINE METHYLSULFATE 1 MG/ML IJ SOLN
INTRAMUSCULAR | Status: AC
  Filled 2010-12-13: qty 10

## 2010-12-13 MED ORDER — FENTANYL CITRATE 0.05 MG/ML IJ SOLN
INTRAMUSCULAR | Status: DC | PRN
  Administered 2010-12-13: 100 ug via INTRAVENOUS

## 2010-12-13 MED ORDER — ROCURONIUM BROMIDE 50 MG/5ML IV SOLN
INTRAVENOUS | Status: AC
  Filled 2010-12-13: qty 1

## 2010-12-13 MED ORDER — ONDANSETRON HCL 4 MG/2ML IJ SOLN
INTRAMUSCULAR | Status: AC
  Administered 2010-12-13: 4 mg via INTRAVENOUS
  Filled 2010-12-13: qty 2

## 2010-12-13 MED ORDER — NEOSTIGMINE METHYLSULFATE 1 MG/ML IJ SOLN
INTRAMUSCULAR | Status: DC | PRN
  Administered 2010-12-13: 3 mg via INTRAMUSCULAR

## 2010-12-13 MED ORDER — MIDAZOLAM HCL 2 MG/2ML IJ SOLN
INTRAMUSCULAR | Status: AC
  Filled 2010-12-13: qty 2

## 2010-12-13 MED ORDER — KETOROLAC TROMETHAMINE 30 MG/ML IJ SOLN
30.0000 mg | Freq: Once | INTRAMUSCULAR | Status: AC
  Administered 2010-12-13: 30 mg via INTRAVENOUS

## 2010-12-13 MED ORDER — POVIDONE-IODINE 10 % OINT PACKET
TOPICAL_OINTMENT | CUTANEOUS | Status: DC | PRN
  Administered 2010-12-13: 1 via TOPICAL

## 2010-12-13 MED ORDER — CEFAZOLIN SODIUM-DEXTROSE 2-3 GM-% IV SOLR
INTRAVENOUS | Status: AC
  Filled 2010-12-13: qty 50

## 2010-12-13 MED ORDER — FENTANYL CITRATE 0.05 MG/ML IJ SOLN
INTRAMUSCULAR | Status: AC
  Filled 2010-12-13: qty 2

## 2010-12-13 MED ORDER — CEFAZOLIN SODIUM 1-5 GM-% IV SOLN
INTRAVENOUS | Status: DC | PRN
  Administered 2010-12-13: 2 g via INTRAVENOUS

## 2010-12-13 MED ORDER — ENOXAPARIN SODIUM 40 MG/0.4ML ~~LOC~~ SOLN
SUBCUTANEOUS | Status: AC
  Filled 2010-12-13: qty 0.4

## 2010-12-13 SURGICAL SUPPLY — 45 items
BAG HAMPER (MISCELLANEOUS) ×2 IMPLANT
CLOTH BEACON ORANGE TIMEOUT ST (SAFETY) ×2 IMPLANT
COVER LIGHT HANDLE STERIS (MISCELLANEOUS) ×4 IMPLANT
DECANTER SPIKE VIAL GLASS SM (MISCELLANEOUS) IMPLANT
DURAPREP 26ML APPLICATOR (WOUND CARE) ×2 IMPLANT
ELECT REM PT RETURN 9FT ADLT (ELECTROSURGICAL) ×2
ELECTRODE REM PT RTRN 9FT ADLT (ELECTROSURGICAL) ×1 IMPLANT
FORMALIN 10 PREFIL 480ML (MISCELLANEOUS) IMPLANT
GAUZE SPONGE 4X4 12PLY STRL LF (GAUZE/BANDAGES/DRESSINGS) ×2 IMPLANT
GLOVE BIO SURGEON STRL SZ7.5 (GLOVE) ×2 IMPLANT
GLOVE BIOGEL PI IND STRL 8.5 (GLOVE) ×1 IMPLANT
GLOVE BIOGEL PI INDICATOR 8.5 (GLOVE) ×1
GLOVE ECLIPSE 6.5 STRL STRAW (GLOVE) ×2 IMPLANT
GLOVE ECLIPSE 7.0 STRL STRAW (GLOVE) ×2 IMPLANT
GLOVE ECLIPSE 8.0 STRL XLNG CF (GLOVE) ×2 IMPLANT
GLOVE INDICATOR 7.0 STRL GRN (GLOVE) ×2 IMPLANT
GOWN BRE IMP SLV AUR XL STRL (GOWN DISPOSABLE) ×6 IMPLANT
GOWN STRL REIN 3XL LVL4 (GOWN DISPOSABLE) ×2 IMPLANT
INST SET MAJOR GENERAL (KITS) IMPLANT
INST SET MINOR GENERAL (KITS) ×2 IMPLANT
KIT ROOM TURNOVER APOR (KITS) ×2 IMPLANT
MANIFOLD NEPTUNE II (INSTRUMENTS) ×2 IMPLANT
NEEDLE HYPO 25X1 1.5 SAFETY (NEEDLE) ×2 IMPLANT
NS IRRIG 1000ML POUR BTL (IV SOLUTION) ×2 IMPLANT
PACK ABDOMINAL MAJOR (CUSTOM PROCEDURE TRAY) IMPLANT
PACK MINOR (CUSTOM PROCEDURE TRAY) ×2 IMPLANT
PAD ARMBOARD 7.5X6 YLW CONV (MISCELLANEOUS) ×2 IMPLANT
PATCH VENTRAL MEDIUM 6.4 (Mesh Specialty) ×2 IMPLANT
SET BASIN LINEN APH (SET/KITS/TRAYS/PACK) ×2 IMPLANT
SPONGE GAUZE 4X4 12PLY (GAUZE/BANDAGES/DRESSINGS) ×2 IMPLANT
STAPLER VISISTAT (STAPLE) ×2 IMPLANT
SUT ETHIBOND NAB MO 7 #0 18IN (SUTURE) ×2 IMPLANT
SUT NOVA NAB GS-21 1 T12 (SUTURE) IMPLANT
SUT NOVA NAB GS-22 2 2-0 T-19 (SUTURE) ×2 IMPLANT
SUT NOVA NAB GS-26 0 60 (SUTURE) IMPLANT
SUT PROLENE 0 CT 1 CR/8 (SUTURE) IMPLANT
SUT SILK 2 0 (SUTURE)
SUT SILK 2-0 18XBRD TIE 12 (SUTURE) IMPLANT
SUT VIC AB 2-0 CT1 27 (SUTURE)
SUT VIC AB 2-0 CT1 TAPERPNT 27 (SUTURE) IMPLANT
SUT VIC AB 3-0 SH 27 (SUTURE) ×1
SUT VIC AB 3-0 SH 27X BRD (SUTURE) ×1 IMPLANT
SUT VIC AB 4-0 PS2 27 (SUTURE) IMPLANT
SYR CONTROL 10ML LL (SYRINGE) ×2 IMPLANT
TAPE CLOTH SURG 4X10 WHT LF (GAUZE/BANDAGES/DRESSINGS) ×2 IMPLANT

## 2010-12-13 NOTE — Transfer of Care (Signed)
Immediate Anesthesia Transfer of Care Note  Patient: Alison [name]  Procedure(s) Performed:  HERNIA REPAIR INCISIONAL - with mesh  Patient Location: PACU  Anesthesia Type: General  Level of Consciousness: awake, alert , oriented and patient cooperative  Airway & Oxygen Therapy: Patient Spontanous Breathing and Patient connected to face mask oxygen  Post-op Assessment: Report given to PACU RN, Post -op Vital signs reviewed and stable and Patient moving all extremities X 4  Post vital signs: stable  Complications: No apparent anesthesia complications

## 2010-12-13 NOTE — Anesthesia Preprocedure Evaluation (Addendum)
Anesthesia Evaluation  Name, MR# and DOB Patient awake  General Assessment Comment  Reviewed: Allergy & Precautions, H&P  and Patient's Chart, lab work & pertinent test results  History of Anesthesia Complications Negative for: history of anesthetic complications  Airway Mallampati: II  Neck ROM: Full    Dental  (+) Teeth Intact and Poor Dentition   Pulmonary  clear to auscultation  breath sounds clear to auscultation none    Cardiovascular Regular Normal    Neuro/Psych   GI/Hepatic/Renal (+) PUD,       Endo/Other    Abdominal   Musculoskeletal   Hematology   Peds  Reproductive/Obstetrics    Anesthesia Other Findings             Anesthesia Physical Anesthesia Plan  ASA: II  Anesthesia Plan: General   Post-op Pain Management:    Induction: Intravenous  Airway Management Planned: Oral ETT  Additional Equipment:   Intra-op Plan:   Post-operative Plan: Extubation in OR  Informed Consent: I have reviewed the patients History and Physical, chart, labs and discussed the procedure including the risks, benefits and alternatives for the proposed anesthesia with the patient or authorized representative who has indicated his/her understanding and acceptance.     Plan Discussed with:   Anesthesia Plan Comments:         Anesthesia Quick Evaluation

## 2010-12-13 NOTE — Op Note (Signed)
Patient:  Alison [name]  DOB:  November 07, 1975  MRN:  [id number]   Preop Diagnosis:  Incisional hernia  Postop Diagnosis:  Same  Procedure:  Incisional herniorrhaphy with mesh  Surgeon:  Franky Macho, M.D.  Anes:  Gen. endotracheal  Indications:  Patient is a 35 year old white female status post a laparoscopic cholecystectomy at another facility earlier this year who developed supraumbilical swelling. A CT scan of the abdomen and pelvis revealed an incisional hernia. Patient now comes to the operative for incisional herniorrhaphy with mesh. The risks and benefits of the procedure including bleeding, infection, and recurrence of the hernia were fully explained to the patient, gave informed consent.  Procedure note:  Patient was placed in the supine position. After induction of general endotracheal anesthesia, the abdomen was prepped and draped using the usual sterile technique with DuraPrep. Surgical site confirmation was performed.  A supraumbilical transverse incision was made through the previous surgical scar. This was extended on either side. Dissection was taken down to the hernia sac. A well-formed hernia sac was found. This was excised down to the fascia. Several adhesions of omentum were freed away in order to reduce the omentum. A 6.4 cm proceed mesh patch was then inserted and was secured to the fascia using 0 Ethibond interrupted sutures. Remaining fascia was reapproximated over this using 0 Ethibond interrupted sutures. The subcutaneous layer was reapproximated using 3-0 Vicryl interrupted sutures. The skin was closed using staples. 0.5% Sensorcaine was instilled the surrounding wound. Betadine ointment pressure dressing were applied.  All tape and needle counts were correct at the end of the procedure. The patient was extubated in the operating room and went back to recovery room awake in stable condition.  Complications:  None  EBL:  Minimal  Specimen:  None

## 2010-12-13 NOTE — Anesthesia Procedure Notes (Signed)
Procedure Name: Intubation Date/Time: 12/13/2010 10:22 AM Performed by: Despina Hidden Pre-anesthesia Checklist: Patient identified, Patient being monitored, Timeout performed, Emergency Drugs available and Suction available Patient Re-evaluated:Patient Re-evaluated prior to inductionOxygen Delivery Method: Circle System Utilized and Simple face mask Preoxygenation: Pre-oxygenation with 100% oxygen Intubation Type: IV induction Ventilation: Mask ventilation without difficulty Laryngoscope Size: Mac and 3 Grade View: Grade I Tube type: Oral Tube size: 7.0 mm Number of attempts: 1 Airway Equipment and Method: stylet Placement Confirmation: breath sounds checked- equal and bilateral,  ETT inserted through vocal cords under direct vision and positive ETCO2 Secured at: 22 cm Tube secured with: Tape Dental Injury: Teeth and Oropharynx as per pre-operative assessment

## 2010-12-13 NOTE — Anesthesia Postprocedure Evaluation (Signed)
  Anesthesia Post-op Note  Patient: Alison [name]  Procedure(s) Performed:  HERNIA REPAIR INCISIONAL - with mesh  Patient Location: PACU  Anesthesia Type: General  Level of Consciousness: awake, alert , oriented and patient cooperative  Airway and Oxygen Therapy: Patient Spontanous Breathing  Post-op Pain: moderate  Post-op Assessment: Post-op Vital signs reviewed, Patient's Cardiovascular Status Stable, Respiratory Function Stable, No signs of Nausea or vomiting and Pain level controlled  Post-op Vital Signs: stable  Complications: No apparent anesthesia complications

## 2010-12-13 NOTE — Interval H&P Note (Signed)
I have evaluated Alison [name] preoperatively, and have identified no interval change in the medical condition or plan of care since the history and physical of record.

## 2010-12-13 NOTE — Telephone Encounter (Signed)
Please call pt. She has ulcers from Ibuprofen. Avoid Ibuprofen and naproxen. BID PPI for 3 mos then qd. OPV in OCT 2012.

## 2010-12-14 NOTE — Telephone Encounter (Signed)
Reminder placed in epic for HBT in 01/2011

## 2010-12-14 NOTE — Telephone Encounter (Signed)
Pt was informed by DS on 12/09/10

## 2010-12-15 ENCOUNTER — Ambulatory Visit: Payer: BC Managed Care – PPO | Admitting: Gastroenterology

## 2010-12-21 ENCOUNTER — Telehealth: Payer: Self-pay | Admitting: General Practice

## 2010-12-21 DIAGNOSIS — R197 Diarrhea, unspecified: Secondary | ICD-10-CM

## 2010-12-21 NOTE — Telephone Encounter (Signed)
Message copied by Jennings Books on Tue Dec 21, 2010  3:54 PM ------      Message from: Lavena Bullion      Created: Tue Dec 21, 2010  1:42 PM       Pt would like to know if she has been scheduled for the HBT. She realizes it is to be scheduled out, she would just like to have the appt.

## 2010-12-22 ENCOUNTER — Encounter (HOSPITAL_COMMUNITY): Payer: Self-pay | Admitting: General Surgery

## 2010-12-22 NOTE — Telephone Encounter (Signed)
Pt is scheduled for 01/12/2011@7 :30am Pt aware and instructions placed in the mail.

## 2011-01-04 NOTE — Telephone Encounter (Signed)
Pt has OV for 9/5 at 4pm with SF

## 2011-01-06 ENCOUNTER — Telehealth: Payer: Self-pay

## 2011-01-06 NOTE — Telephone Encounter (Signed)
Please call pt. I can see her on 9/5 but I cannot manage her hernia issues. I did speak to Dr. Lovell Sheehan. He is aware of her concerns. He wants to give the area time to heal. If the know persits he will get a CT Scan to look at the area. Follow up with Dr. Lovell Sheehan for the hernia evaluation OPV w/ [facility] next week.

## 2011-01-06 NOTE — Telephone Encounter (Signed)
Pt called and said she has a hard knot near the hernia repair. Surgeon (Dr. Lovell Sheehan ) cannot see her until next week. She has appt with Dr. Darrick Penna on 01/12/2011. She is fearful that the hernia repair was not a success. Please advise! Call back number is [phone].

## 2011-01-06 NOTE — Telephone Encounter (Signed)
Pt informed

## 2011-01-07 ENCOUNTER — Encounter (HOSPITAL_COMMUNITY): Payer: Self-pay | Admitting: *Deleted

## 2011-01-07 ENCOUNTER — Emergency Department (HOSPITAL_COMMUNITY)
Admission: EM | Admit: 2011-01-07 | Discharge: 2011-01-07 | Disposition: A | Discharge status: Home / Self Care | Payer: BC Managed Care – PPO | Attending: Emergency Medicine | Admitting: Emergency Medicine

## 2011-01-07 DIAGNOSIS — K469 Unspecified abdominal hernia without obstruction or gangrene: Secondary | ICD-10-CM | POA: Insufficient documentation

## 2011-01-07 DIAGNOSIS — F17200 Nicotine dependence, unspecified, uncomplicated: Secondary | ICD-10-CM | POA: Insufficient documentation

## 2011-01-07 NOTE — ED Notes (Signed)
C/o pain in surgical site, also has a knot in the area, s/p hernia repair 12-13-2010

## 2011-01-07 NOTE — ED Provider Notes (Signed)
History     CSN: [id number] Arrival date & time: 01/07/2011  8:32 PM  Chief Complaint  Patient presents with  . Hernia   HPI Comments: Pt had midline abdominal hernia repair by dr. Lovell Sheehan ~ 1 month ago.  "bulge" again noted with abd muscle tightening.    The history is provided by the patient. No language interpreter was used.    Past Medical History  Diagnosis Date  . Bloating JAN 2012  . Umbilical hernia   . Ulcer     Past Surgical History  Procedure Date  . Cesarean section X2  . Cholecystectomy APR 2012 [facility]    BILIARY DYSKINESIA, path: CHRONIC CHOLECYSTITIS  . Tubal ligation   . Esophagogastroduodenoscopy   . Esophagogastroduodenoscopy 12/03/2010    Procedure: ESOPHAGOGASTRODUODENOSCOPY (EGD);  Surgeon: Arlyce Harman, MD;  Location: [facility];  Service: Endoscopy;  Laterality: N/A;  . Incisional hernia repair 12/13/2010    Procedure: HERNIA REPAIR INCISIONAL;  Surgeon: Dalia Heading;  Location: [facility];  Service: General;  Laterality: N/A;  with mesh  . Hernia repair     Family History  Problem Relation Age of Onset  . Hyperlipidemia Mother     History  Substance Use Topics  . Smoking status: Current Everyday Smoker -- 1.5 packs/day for 10 years    Types: Cigarettes  . Smokeless tobacco: Never Used   Comment: 10 cigarettes daily  . Alcohol Use: Not on file    OB History    Grav Para Term Preterm Abortions TAB SAB Ect Mult Living   2 2 1 1             Review of Systems  Gastrointestinal: Negative for nausea, vomiting and diarrhea.       Abd hernia  All other systems reviewed and are negative.    Physical Exam  BP 137/100  Pulse 91  Temp(Src) 98.5 F (36.9 C) (Oral)  Resp 20  Ht 5\' 7"  (1.702 m)  Wt 240 lb (108.863 kg)  BMI 37.59 kg/m2  SpO2 100%  Physical Exam  Nursing note and vitals reviewed. Constitutional: She is oriented to person, place, and time. Vital signs are normal. She appears well-developed and well-nourished. No distress.   HENT:  Head: Normocephalic and atraumatic.  Right Ear: External ear normal.  Left Ear: External ear normal.  Nose: Nose normal.  Mouth/Throat: No oropharyngeal exudate.  Eyes: Conjunctivae and EOM are normal. Pupils are equal, round, and reactive to light. Right eye exhibits no discharge. Left eye exhibits no discharge. No scleral icterus.  Neck: Normal range of motion. Neck supple. No JVD present. No tracheal deviation present. No thyromegaly present.  Cardiovascular: Normal rate, regular rhythm, normal heart sounds, intact distal pulses and normal pulses.  Exam reveals no gallop and no friction rub.   No murmur heard. Pulmonary/Chest: Effort normal and breath sounds normal. No stridor. No respiratory distress. She has no wheezes. She has no rales. She exhibits no tenderness.  Abdominal: Soft. Normal appearance and bowel sounds are normal. She exhibits no distension and no mass. There is no tenderness. There is no rebound and no guarding.    Musculoskeletal: Normal range of motion. She exhibits no edema and no tenderness.  Lymphadenopathy:    She has no cervical adenopathy.  Neurological: She is alert and oriented to person, place, and time. She has normal reflexes. No cranial nerve deficit. Coordination normal. GCS eye subscore is 4. GCS verbal subscore is 5. GCS motor subscore is 6.  Skin:  Skin is warm and dry. No rash noted. She is not diaphoretic.  Psychiatric: She has a normal mood and affect. Her speech is normal and behavior is normal. Judgment and thought content normal. Cognition and memory are normal.    ED Course  Procedures  MDM       Worthy Rancher, PA 01/07/11 2101

## 2011-01-08 NOTE — ED Provider Notes (Signed)
Medical screening examination/treatment/procedure(s) were performed by non-physician practitioner and as supervising physician I was immediately available for consultation/collaboration.  Glynn Octave, MD 01/08/11 [phone]

## 2011-01-11 ENCOUNTER — Other Ambulatory Visit (HOSPITAL_COMMUNITY): Payer: Self-pay | Admitting: Orthopedic Surgery

## 2011-01-11 ENCOUNTER — Telehealth: Payer: Self-pay

## 2011-01-11 DIAGNOSIS — K469 Unspecified abdominal hernia without obstruction or gangrene: Secondary | ICD-10-CM

## 2011-01-11 NOTE — Telephone Encounter (Signed)
LMOM for pt to call. 

## 2011-01-11 NOTE — Telephone Encounter (Signed)
Instructions for HBT for SIBO should include no recent antibiotics. No high fiber foods such as beans, high fiber cereal or fiber supplements within 24 hours. If she has taken fiber supplement or eaten high fiber foods today, then I would suggest rescheduling. Keep appt with [facility] tomorrow.

## 2011-01-11 NOTE — Telephone Encounter (Signed)
Pt informed

## 2011-01-11 NOTE — Telephone Encounter (Signed)
Pt called and said that she did have to go back to the ED. She does have the hernia again. She is Scheduled for the HBT for tomorrow. Just wants to make sure it is OK to proceed since she does have the hernia. Also, She wants to know should she take her Prilosec and fiber in the AM before she goes for the test. She said her instructions did not mention anything about meds. She has to be at the hospital in the AM at 7:00 AM. Please advise!

## 2011-01-12 ENCOUNTER — Ambulatory Visit (HOSPITAL_COMMUNITY)
Admission: RE | Admit: 2011-01-12 | Discharge: 2011-01-12 | Disposition: A | Discharge status: Home / Self Care | Payer: BC Managed Care – PPO | Source: Ambulatory Visit | Attending: Orthopedic Surgery | Admitting: Orthopedic Surgery

## 2011-01-12 ENCOUNTER — Encounter: Payer: Self-pay | Admitting: Gastroenterology

## 2011-01-12 ENCOUNTER — Encounter (HOSPITAL_COMMUNITY)
Admission: RE | Disposition: A | Discharge status: Home / Self Care | Payer: Self-pay | Source: Ambulatory Visit | Attending: Gastroenterology

## 2011-01-12 ENCOUNTER — Encounter (HOSPITAL_COMMUNITY): Payer: Self-pay | Admitting: *Deleted

## 2011-01-12 ENCOUNTER — Ambulatory Visit (HOSPITAL_COMMUNITY)
Admission: RE | Admit: 2011-01-12 | Discharge: 2011-01-12 | Disposition: A | Discharge status: Home / Self Care | Payer: BC Managed Care – PPO | Source: Ambulatory Visit | Attending: Gastroenterology | Admitting: Gastroenterology

## 2011-01-12 ENCOUNTER — Ambulatory Visit (INDEPENDENT_AMBULATORY_CARE_PROVIDER_SITE_OTHER): Payer: BC Managed Care – PPO | Admitting: Gastroenterology

## 2011-01-12 VITALS — BP 145/102 | HR 98 | Temp 97.8°F | Ht 67.0 in | Wt 249.6 lb

## 2011-01-12 DIAGNOSIS — R141 Gas pain: Secondary | ICD-10-CM

## 2011-01-12 DIAGNOSIS — K429 Umbilical hernia without obstruction or gangrene: Secondary | ICD-10-CM | POA: Insufficient documentation

## 2011-01-12 DIAGNOSIS — R142 Eructation: Secondary | ICD-10-CM

## 2011-01-12 DIAGNOSIS — R109 Unspecified abdominal pain: Secondary | ICD-10-CM | POA: Insufficient documentation

## 2011-01-12 DIAGNOSIS — R143 Flatulence: Secondary | ICD-10-CM | POA: Insufficient documentation

## 2011-01-12 DIAGNOSIS — K279 Peptic ulcer, site unspecified, unspecified as acute or chronic, without hemorrhage or perforation: Secondary | ICD-10-CM | POA: Insufficient documentation

## 2011-01-12 DIAGNOSIS — K469 Unspecified abdominal hernia without obstruction or gangrene: Secondary | ICD-10-CM

## 2011-01-12 DIAGNOSIS — R140 Abdominal distension (gaseous): Secondary | ICD-10-CM

## 2011-01-12 HISTORY — PX: HYDROGEN BREATH TEST: SHX5529

## 2011-01-12 SURGERY — HYDROGEN BREATH TEST
Anesthesia: LOCAL

## 2011-01-12 MED ORDER — IOHEXOL 300 MG/ML  SOLN
100.0000 mL | Freq: Once | INTRAMUSCULAR | Status: AC | PRN
  Administered 2011-01-12: 100 mL via INTRAVENOUS

## 2011-01-12 MED ORDER — METRONIDAZOLE 500 MG PO TABS: ORAL_TABLET | ORAL | Status: DC

## 2011-01-12 MED ORDER — LACTULOSE 10 GM/15ML PO SOLN
ORAL | Status: AC
  Administered 2011-01-12: 25 g via ORAL
  Filled 2011-01-12: qty 60

## 2011-01-12 MED ORDER — LACTULOSE 10 GM/15ML PO SOLN
25.0000 g | Freq: Once | ORAL | Status: AC
  Administered 2011-01-12: 25 g via ORAL

## 2011-01-12 MED ORDER — CIPROFLOXACIN HCL 500 MG PO TABS: ORAL_TABLET | ORAL | Status: DC

## 2011-01-12 NOTE — Assessment & Plan Note (Addendum)
REPEAT EGD IN EARLY NOV. CONTINUE OMEPRAZOLE TWICE DAILY UNTIL OCT 27 THEN TAKE ONCE DAILY.

## 2011-01-12 NOTE — Patient Instructions (Addendum)
YOU HAVE A COUPLE OF REASONS TO FEEL BLOATED: 1. YOUR HERNIA, 2. POSSIBLE SMALL BOWEL BACTERIAL OVERGROWTH, AND 3. MILD IBS.  May resume dairy if it does not increase bloating. Take Cipro and Flagyl 500 mg bid for 7 days. Stop meds if no Sx improvement after 5 day. Continue probiotics daily. See Dr. Lovell Sheehan RE: management of your umbilical hernia. REPEAT EGD IN LATE OCT/EARLY NOV. CONTINUE OMEPRAZOLE TWICE DAILY UNTIL OCT 27 THEN TAKE ONCE DAILY. TYLENOL AS NEEDED FOR PAIN. FOLLOW UP IN FEB 2013.  Bloating Bloating is the feeling of fullness in your belly. You may feel as though your pants are too tight. Often the cause of bloating is overeating, retaining fluids, or having gas in your bowel. It is also caused by swallowing air and eating foods that cause gas. Irritable bowel syndrome is one of the most common causes of bloating. Constipation is also a common cause. Sometimes more serious problems can cause bloating.  SYMPTOMS Usually there is a feeling of fullness, as though your abdomen is bulged out. There may be mild discomfort.   TREATMENT  There is no direct treatment for bloating.   Do not put gas into the bowel. Avoid chewing gum and sucking on candy. These tend to make you swallow air. Swallowing air can also be a nervous habit. Try to avoid this.   Avoiding high residue diets will help. Eat foods with soluble fibers (examples include, root vegetables, apples, barley) and substitute dairy products with soy and rice products.   Avoid carbonated beverages.   Over the counter preparations are available that help reduce gas: Gas-X or SIMETHICONE.

## 2011-01-12 NOTE — Progress Notes (Signed)
Subjective:    Patient ID: Alison [name], female    DOB: 1976-04-25, 35 y.o.   MRN: [id number]  [facility]: [facility]  HPI STILL HAVING BLOATING. Taking [employer] probiotic daily. After had hernia fixed it seemed to go down and now seems to be going back up. NKDA. LMP: AUG 28-NO OCP, TUBES ARE TIED. TAKIGN FIBER BID. MAKES HER RUN TO BR. Doesn't think fiber causea more bloating. Hernia bothers her because it hurts when she bumps it, makes her stomach lopsided, and she can't wear her clothes. Need repeat egd in late OCT or early NOV.   Past Medical History  Diagnosis Date  . Bloating JAN 2012    SEP 2012 HBT C/W SIBO(1ST pk 15, 2nd pk 21)  . Umbilical hernia   . Ulcer     Past Surgical History  Procedure Date  . Cesarean section X2  . Cholecystectomy APR 2012 [facility]    BILIARY DYSKINESIA, path: CHRONIC CHOLECYSTITIS  . Tubal ligation   . Esophagogastroduodenoscopy   . Esophagogastroduodenoscopy 12/03/2010    Procedure: ESOPHAGOGASTRODUODENOSCOPY (EGD);  Surgeon: Arlyce Harman, MD;  [facility];  Service: Endoscopy;  Laterality: N/A;  . Incisional hernia repair 12/13/2010    Procedure: HERNIA REPAIR INCISIONAL;  Surgeon: Dalia Heading;  Location: [facility];  Service: General;  Laterality: N/A;  with mesh  . Hernia repair     No Known Allergies  Current Outpatient Prescriptions  Medication Sig Dispense Refill  . acetaminophen (TYLENOL) 500 MG tablet Take 1,000 mg by mouth daily as needed. For pain       . dicyclomine (BENTYL) 20 MG tablet Take 1 tablet (20 mg total) by mouth 2 (two) times daily.  20 tablet  0  . HYDROcodone-acetaminophen (NORCO) 5-325 MG per tablet 1 - 2 tablets po q4hrs prn pain  40 tablet  0  . multivitamin (THERAGRAN) tablet Take 1 tablet by mouth daily.        Marland Kitchen omeprazole (PRILOSEC) 20 MG capsule Take 20 mg by mouth 2 (two) times daily. 1 PO BID       . polycarbophil (FIBERCON) 625 MG tablet Take 625 mg by mouth 2 (two) times daily as  needed. Constipation      . Probiotic Product (MISC INTESTINAL FLORA REGULAT) CHEW Chew 1 tablet by mouth at bedtime.       . Probiotic Product (PROBIOTIC FORMULA PO) Take 1 capsule by mouth daily.        . Simethicone 125 MG CAPS Take 2 capsules by mouth 2 (two) times daily.        Marland Kitchen tetrahydrozoline-zinc (VISINE-AC) 0.05-0.25 % ophthalmic solution Place 1 drop into both eyes daily as needed. Dry Eyes       . therapeutic multivitamin-minerals (THERAGRAN-M) tablet Take 1 tablet by mouth daily.        . ciprofloxacin (CIPRO) 500 MG tablet 1 po bid for 7 days  14 tablet  0  . famotidine (PEPCID) 20 MG tablet Take 1 tablet (20 mg total) by mouth 2 (two) times daily.  30 tablet  0  . metroNIDAZOLE (FLAGYL) 500 MG tablet 1 po bid for 7 days  14 tablet  4   Review of Systems     Objective:   Physical Exam  Constitutional: She is oriented to person, place, and time. She appears well-developed and well-nourished. No distress.  HENT:  Head: Normocephalic and atraumatic.  Mouth/Throat: Oropharynx is clear and moist. No oropharyngeal exudate.  Eyes: Pupils  are equal, round, and reactive to light.  Neck: Normal range of motion. Neck supple.  Cardiovascular: Normal rate, regular rhythm and normal heart sounds.   Pulmonary/Chest: Effort normal and breath sounds normal.  Abdominal: Soft. Bowel sounds are normal. She exhibits distension. There is tenderness (MILD TTP IN THE PERIUMBILICAL REGION). There is no rebound and no guarding.       OBESE, BULGE APPRECIATED ABOVE UMBILICAL INCISION-well healed  Musculoskeletal: She exhibits no edema.  Neurological: She is alert and oriented to person, place, and time.       NO FOCAL DEFICITS  Psychiatric: She has a normal mood and affect.          Assessment & Plan:

## 2011-01-12 NOTE — Progress Notes (Signed)
Pt. Identified & verified procedure.  Pt. States NPO status, no antibiotics, or beans, brans or high fiber cereal intake.  No exercising or smoking prior to procedure.

## 2011-01-13 ENCOUNTER — Encounter: Payer: Self-pay | Admitting: Gastroenterology

## 2011-01-13 NOTE — Assessment & Plan Note (Signed)
REASONS for bloating: 1. HERNIA, 2. POSSIBLE SMALL BOWEL BACTERIAL OVERGROWTH, AND 3. MILD IBS.  May resume dairy if it does not increase bloating. Take Cipro and Flagyl 500 mg bid for 7 days. Stop meds if no Sx improvement after 5 day. Continue probiotics daily. See Dr. Lovell Sheehan RE: management of your umbilical hernia. TYLENOL AS NEEDED FOR PAIN. FOLLOW UP IN FEB 2013.

## 2011-01-13 NOTE — Brief Op Note (Addendum)
01/12/2011  9:19 AM  PATIENT:  Alison [name]  35 y.o. female with bloating.   PRE-OPERATIVE DIAGNOSIS:  BLOATING, EVALUATE FOR SBBO  POST-OPERATIVE DIAGNOSIS:  SIBO  PROCEDURE:  Procedure(s): HYDROGEN BREATH TEST  SURGEON:  Surgeon(s): Arlyce Harman, MD  MEDICATIONS USED:  LACTULOSE  FINDINGS: BREATHALYZER- O MIN(6 PPM), FIRST PEAK (15 PPM, 120 MINS), TROUGH (13 PPM 135 MINS), SECOND PEAK (21 PPM, 150 MINS)  DIAGNOSIS: SMALL BOWEL BACTERIAL OVERGROWTH. Differential diagnosis includes colonic reading caused 2nd peak.  PLAN OF CARE:  1. CIP/FLAG for 7 days. 2. Follow up in FEB 2013

## 2011-01-14 ENCOUNTER — Other Ambulatory Visit (HOSPITAL_COMMUNITY): Payer: BC Managed Care – PPO

## 2011-01-14 NOTE — H&P (Signed)
Alison [name] is an 35 y.o. female.   Chief Complaint: Recurrent incisional hernia HPI: Alison [name] is a 34 year old white female who originally on a laparoscopic cystectomy at another facility. She developed a umbilical surgical site hernia. I initially repaired this several months ago with mesh. Recently, she began experiencing pain just superior to the umbilicus. A repeat CT scan reveals disruption of the mesh that had been placed. She now comes the operative room for recurrent incisional hernia.  Past Medical History  Diagnosis Date  . Bloating JAN 2012    SEP 2012 HBT C/W SIBO(1ST pk 15, 2nd pk 21)  . Umbilical hernia   . Ulcer     Past Surgical History  Procedure Date  . Cesarean section X2  . Cholecystectomy APR 2012 [facility]    BILIARY DYSKINESIA, path: CHRONIC CHOLECYSTITIS  . Tubal ligation   . Esophagogastroduodenoscopy   . Esophagogastroduodenoscopy 12/03/2010    Procedure: ESOPHAGOGASTRODUODENOSCOPY (EGD);  Surgeon: Arlyce Harman, MD;  [facility];  Service: Endoscopy;  Laterality: N/A;  . Incisional hernia repair 12/13/2010    Procedure: HERNIA REPAIR INCISIONAL;  Surgeon: Dalia Heading;  Location: [facility];  Service: General;  Laterality: N/A;  with mesh  . Hernia repair     Family History  Problem Relation Age of Onset  . Hyperlipidemia Mother    Social History:  reports that she has been smoking Cigarettes.  She has a 15 pack-year smoking history. She has never used smokeless tobacco. She reports that she does not use illicit drugs. Her alcohol history not on file.  Allergies: No Known Allergies  Medications Prior to Admission  Medication Dose Route Frequency Provider Last Rate Last Dose  . iohexol (OMNIPAQUE) 300 MG/ML injection 100 mL  100 mL Intravenous Once PRN Medication Radiologist   100 mL at 01/12/11 1502  . lactulose (CHRONULAC) 10 GM/15ML solution 25 g  25 g Oral Once Arlyce Harman, MD   25 g at 01/12/11 0730   Medications Prior to Admission    Medication Sig Dispense Refill  . acetaminophen (TYLENOL) 500 MG tablet Take 1,000 mg by mouth daily as needed. For pain       . ciprofloxacin (CIPRO) 500 MG tablet Take 500 mg by mouth 2 (two) times daily.        Marland Kitchen dicyclomine (BENTYL) 20 MG tablet Take 1 tablet (20 mg total) by mouth 2 (two) times daily.  20 tablet  0  . famotidine (PEPCID) 20 MG tablet Take 1 tablet (20 mg total) by mouth 2 (two) times daily.  30 tablet  0  . HYDROcodone-acetaminophen (NORCO) 5-325 MG per tablet 1 - 2 tablets po q4hrs prn pain  40 tablet  0  . metroNIDAZOLE (FLAGYL) 500 MG tablet Take 500 mg by mouth 2 (two) times daily.        . multivitamin (THERAGRAN) tablet Take 1 tablet by mouth daily.        Marland Kitchen omeprazole (PRILOSEC) 20 MG capsule Take 20 mg by mouth 2 (two) times daily.       . polycarbophil (FIBERCON) 625 MG tablet Take 625 mg by mouth 2 (two) times daily as needed. Constipation      . Probiotic Product (MISC INTESTINAL FLORA REGULAT) CHEW Chew 1 tablet by mouth at bedtime.       . Probiotic Product (PROBIOTIC FORMULA PO) Take 1 capsule by mouth daily.        . Simethicone 125 MG CAPS Take 2 capsules by mouth  2 (two) times daily.        Marland Kitchen tetrahydrozoline-zinc (VISINE-AC) 0.05-0.25 % ophthalmic solution Place 1 drop into both eyes daily as needed. Dry Eyes       . therapeutic multivitamin-minerals (THERAGRAN-M) tablet Take 1 tablet by mouth daily.          No results found for this or any previous visit (from the past 48 hour(s)). Ct Abdomen Pelvis W Contrast  01/12/2011  *RADIOLOGY REPORT*  Clinical Data: Abdominal pain, hernia repair 4 weeks ago  CT ABDOMEN AND PELVIS WITH CONTRAST  Technique:  Multidetector CT imaging of the abdomen and pelvis was performed following the standard protocol during bolus administration of intravenous contrast.  Contrast: 100 ml Omnipaque-300  Comparison: 11/30/2010  Findings: Lung bases clear.  Normal heart size.  No pericardial or pleural effusion.  No hiatal hernia.   Abdomen:  Previous cholecystectomy noted.  Liver, biliary system, pancreas, spleen, adrenal glands, and kidneys are within normal limits for age and demonstrate no acute finding.  Anterior abdominal wall demonstrates a recurrent fat containing umbilical hernia with adjacent mesh material.  Hernia defect roughly measures 4.3 cm in diameter, image 47.  No protrusion of bowel.  No fluid collection or abdominal wall abscess.  No bowel obstruction pattern, dilatation, or ileus.  Normal appendix identified.  No adenopathy.  Pelvis:  Normal urinary bladder.  Uterus is midline.  Ovaries are symmetric in size.  No pelvic free fluid, fluid collection, hemorrhage, abscess, inguinal abnormality, or hernia.  No acute distal bowel process.  No acute osseous finding.  IMPRESSION: Recurrent fat containing umbilical hernia with adjacent postoperative findings.  No associated bowel obstruction or abscess.  Original Report Authenticated By: Judie Petit. Ruel Favors, M.D.    Review of Systems  Constitutional: Negative.   HENT: Negative.   Eyes: Negative.   Respiratory: Negative.   Cardiovascular: Negative.   Gastrointestinal: Positive for abdominal pain.  Genitourinary: Negative.   Musculoskeletal: Negative.   Skin: Negative.   Neurological: Negative.   Endo/Heme/Allergies: Negative.   Psychiatric/Behavioral: Negative.     There were no vitals taken for this visit. Physical Exam  Constitutional: She is oriented to person, place, and time. She appears well-developed and well-nourished.  HENT:  Head: Normocephalic.  Eyes: Pupils are equal, round, and reactive to light.  Neck: Normal range of motion. Neck supple.  Cardiovascular: Normal rate, regular rhythm and normal heart sounds.   Respiratory: Effort normal and breath sounds normal.  GI: Soft. Bowel sounds are normal.       Reducible supraumbilical hernia present.  Neurological: She is alert and oriented to person, place, and time.  Skin: Skin is warm and dry.       Assessment/Plan Impression: Recurrent incisional hernia, morbid obesity Plan: Alison [name] will undergo a recurrent incisional herniorrhaphy with mesh on 01/24/2011. Risks and benefits of the procedure including bleeding, infection, and a possibly recurrence of the hernia were fully explained to the Alison [name], gave informed consent.  Nyliah Nierenberg A 01/14/2011, 1:37 PM

## 2011-01-19 ENCOUNTER — Encounter (HOSPITAL_COMMUNITY): Payer: Self-pay

## 2011-01-19 ENCOUNTER — Encounter (HOSPITAL_COMMUNITY)
Admission: RE | Admit: 2011-01-19 | Discharge: 2011-01-19 | Disposition: A | Discharge status: Home / Self Care | Payer: BC Managed Care – PPO | Source: Ambulatory Visit | Attending: General Surgery | Admitting: General Surgery

## 2011-01-19 LAB — CBC
HCT: 40.6 % (ref 36.0–46.0)
Platelets: 246 10*3/uL (ref 150–400)
RBC: 4.45 MIL/uL (ref 3.87–5.11)
RDW: 12.9 % (ref 11.5–15.5)
WBC: 5.1 10*3/uL (ref 4.0–10.5)

## 2011-01-19 LAB — DIFFERENTIAL
Eosinophils Absolute: 0.2 10*3/uL (ref 0.0–0.7)
Lymphocytes Relative: 27 % (ref 12–46)
Lymphs Abs: 1.4 10*3/uL (ref 0.7–4.0)
Monocytes Relative: 9 % (ref 3–12)
Neutro Abs: 3.1 10*3/uL (ref 1.7–7.7)
Neutrophils Relative %: 60 % (ref 43–77)

## 2011-01-19 LAB — BASIC METABOLIC PANEL
Chloride: 106 mEq/L (ref 96–112)
Creatinine, Ser: 0.62 mg/dL (ref 0.50–1.10)
GFR calc Af Amer: >60 mL/min (ref >60)
Sodium: 137 mEq/L (ref 135–145)

## 2011-01-19 LAB — SURGICAL PCR SCREEN
MRSA, PCR: NEGATIVE
Staphylococcus aureus: NEGATIVE

## 2011-01-19 NOTE — Patient Instructions (Addendum)
20 Alison [name]  01/19/2011   Your procedure is scheduled on:  01/24/2011  Report to [facility], [place] at  800  AM.  Call this number if you have problems the morning of surgery: [phone]   Remember:   Do not eat food:After Midnight.  Do not drink clear liquids: After Midnight.  Take these medicines the morning of surgery with A SIP OF WATER: Norco,Pepcid,Prilosec   Do not wear jewelry, make-up or nail polish.  Do not wear lotions, powders, or perfumes. You may wear deodorant.  Do not shave 48 hours prior to surgery.  Do not bring valuables to the hospital.  Contacts, dentures or bridgework may not be worn into surgery.  Leave suitcase in the car. After surgery it may be brought to your room.  For patients admitted to the hospital, checkout time is 11:00 AM the day of discharge.   Patients discharged the day of surgery will not be allowed to drive home.  Name and phone number of your driver: family  Special Instructions: CHG Shower Use Special Wash: 1/2 bottle night before surgery and 1/2 bottle morning of surgery.   Please read over the following fact sheets that you were given: Pain Booklet, MRSA Information, Surgical Site Infection Prevention, Anesthesia Post-op Instructions and Care and Recovery After Surgery PATIENT INSTRUCTIONS POST-ANESTHESIA  IMMEDIATELY FOLLOWING SURGERY:  Do not drive or operate machinery for the first twenty four hours after surgery.  Do not make any important decisions for twenty four hours after surgery or while taking narcotic pain medications or sedatives.  If you develop intractable nausea and vomiting or a severe headache please notify your doctor immediately.  FOLLOW-UP:  Please make an appointment with your surgeon as instructed. You do not need to follow up with anesthesia unless specifically instructed to do so.  WOUND CARE INSTRUCTIONS (if applicable):  Keep a dry clean dressing on the anesthesia/puncture wound site if there is drainage.  Once the  wound has quit draining you may leave it open to air.  Generally you should leave the bandage intact for twenty four hours unless there is drainage.  If the epidural site drains for more than 36-48 hours please call the anesthesia department.  QUESTIONS?:  Please feel free to call your physician or the hospital operator if you have any questions, and they will be happy to assist you.     [facility] Anesthesia Department [address] [place] [phone]

## 2011-01-24 ENCOUNTER — Encounter (HOSPITAL_COMMUNITY): Payer: Self-pay | Admitting: Anesthesiology

## 2011-01-24 ENCOUNTER — Ambulatory Visit (HOSPITAL_COMMUNITY)
Admission: RE | Admit: 2011-01-24 | Discharge: 2011-01-24 | Disposition: A | Discharge status: Home / Self Care | Payer: BC Managed Care – PPO | Source: Ambulatory Visit | Attending: General Surgery | Admitting: General Surgery

## 2011-01-24 ENCOUNTER — Encounter (HOSPITAL_COMMUNITY)
Admission: RE | Disposition: A | Discharge status: Home / Self Care | Payer: Self-pay | Source: Ambulatory Visit | Attending: General Surgery

## 2011-01-24 ENCOUNTER — Ambulatory Visit (HOSPITAL_COMMUNITY): Payer: BC Managed Care – PPO | Admitting: Anesthesiology

## 2011-01-24 ENCOUNTER — Encounter (HOSPITAL_COMMUNITY): Payer: Self-pay | Admitting: *Deleted

## 2011-01-24 DIAGNOSIS — E785 Hyperlipidemia, unspecified: Secondary | ICD-10-CM | POA: Insufficient documentation

## 2011-01-24 DIAGNOSIS — K432 Incisional hernia without obstruction or gangrene: Secondary | ICD-10-CM | POA: Insufficient documentation

## 2011-01-24 DIAGNOSIS — Z01812 Encounter for preprocedural laboratory examination: Secondary | ICD-10-CM | POA: Insufficient documentation

## 2011-01-24 HISTORY — PX: INCISIONAL HERNIA REPAIR: SHX193

## 2011-01-24 SURGERY — REPAIR, HERNIA, INCISIONAL
Anesthesia: General | Site: Abdomen | Wound class: Clean

## 2011-01-24 MED ORDER — BUPIVACAINE HCL (PF) 0.5 % IJ SOLN
INTRAMUSCULAR | Status: AC
  Filled 2011-01-24: qty 30

## 2011-01-24 MED ORDER — FENTANYL CITRATE 0.05 MG/ML IJ SOLN
INTRAMUSCULAR | Status: AC
  Filled 2011-01-24: qty 2

## 2011-01-24 MED ORDER — ROCURONIUM BROMIDE 50 MG/5ML IV SOLN
INTRAVENOUS | Status: AC
  Filled 2011-01-24: qty 1

## 2011-01-24 MED ORDER — MIDAZOLAM HCL 2 MG/2ML IJ SOLN
INTRAMUSCULAR | Status: AC
  Filled 2011-01-24: qty 2

## 2011-01-24 MED ORDER — FENTANYL CITRATE 0.05 MG/ML IJ SOLN
INTRAMUSCULAR | Status: AC
  Administered 2011-01-24: 50 ug via INTRAVENOUS
  Filled 2011-01-24: qty 2

## 2011-01-24 MED ORDER — ENOXAPARIN SODIUM 40 MG/0.4ML ~~LOC~~ SOLN
40.0000 mg | Freq: Once | SUBCUTANEOUS | Status: DC
  Administered 2011-01-24: 40 mg via SUBCUTANEOUS

## 2011-01-24 MED ORDER — PROPOFOL 10 MG/ML IV EMUL
INTRAVENOUS | Status: DC | PRN
  Administered 2011-01-24: 150 mg via INTRAVENOUS

## 2011-01-24 MED ORDER — GLYCOPYRROLATE 0.2 MG/ML IJ SOLN
INTRAMUSCULAR | Status: DC | PRN
  Administered 2011-01-24: .4 mg via INTRAVENOUS

## 2011-01-24 MED ORDER — ONDANSETRON HCL 4 MG/2ML IJ SOLN: 4.0000 mg | Freq: Once | INTRAMUSCULAR | Status: DC | PRN

## 2011-01-24 MED ORDER — GLYCOPYRROLATE 0.2 MG/ML IJ SOLN
INTRAMUSCULAR | Status: AC
  Filled 2011-01-24: qty 1

## 2011-01-24 MED ORDER — SODIUM CHLORIDE 0.9 % IR SOLN
Status: DC | PRN
  Administered 2011-01-24: 1000 mL

## 2011-01-24 MED ORDER — CEFAZOLIN SODIUM 1-5 GM-% IV SOLN
INTRAVENOUS | Status: AC
  Filled 2011-01-24: qty 100

## 2011-01-24 MED ORDER — FENTANYL CITRATE 0.05 MG/ML IJ SOLN
INTRAMUSCULAR | Status: DC | PRN
  Administered 2011-01-24: 50 ug via INTRAVENOUS
  Administered 2011-01-24: 100 ug via INTRAVENOUS
  Administered 2011-01-24: 50 ug via INTRAVENOUS
  Administered 2011-01-24: 100 ug via INTRAVENOUS

## 2011-01-24 MED ORDER — MIDAZOLAM HCL 2 MG/2ML IJ SOLN
INTRAMUSCULAR | Status: AC
  Administered 2011-01-24: 1 mg via INTRAVENOUS
  Filled 2011-01-24: qty 2

## 2011-01-24 MED ORDER — HYDROCODONE-ACETAMINOPHEN 5-325 MG PO TABS: ORAL_TABLET | ORAL | Status: DC

## 2011-01-24 MED ORDER — FENTANYL CITRATE 0.05 MG/ML IJ SOLN
25.0000 ug | INTRAMUSCULAR | Status: DC | PRN
  Administered 2011-01-24 (×4): 50 ug via INTRAVENOUS

## 2011-01-24 MED ORDER — MIDAZOLAM HCL 5 MG/5ML IJ SOLN
INTRAMUSCULAR | Status: DC | PRN
  Administered 2011-01-24: 2 mg via INTRAVENOUS

## 2011-01-24 MED ORDER — GLYCOPYRROLATE 0.2 MG/ML IJ SOLN
INTRAMUSCULAR | Status: AC
  Administered 2011-01-24: 0.2 mg
  Filled 2011-01-24: qty 1

## 2011-01-24 MED ORDER — POVIDONE-IODINE 10 % OINT PACKET
TOPICAL_OINTMENT | CUTANEOUS | Status: DC | PRN
  Administered 2011-01-24: 1 via TOPICAL

## 2011-01-24 MED ORDER — MIDAZOLAM HCL 2 MG/2ML IJ SOLN
INTRAMUSCULAR | Status: AC
  Administered 2011-01-24: 2 mg via INTRAVENOUS
  Filled 2011-01-24: qty 2

## 2011-01-24 MED ORDER — CEFAZOLIN SODIUM-DEXTROSE 2-3 GM-% IV SOLR: 2.0000 g | INTRAVENOUS | Status: DC

## 2011-01-24 MED ORDER — LIDOCAINE HCL 1 % IJ SOLN
INTRAMUSCULAR | Status: DC | PRN
  Administered 2011-01-24: 50 mg via INTRADERMAL

## 2011-01-24 MED ORDER — LACTATED RINGERS IV SOLN
INTRAVENOUS | Status: DC | PRN
  Administered 2011-01-24: 09:00:00 via INTRAVENOUS

## 2011-01-24 MED ORDER — MIDAZOLAM HCL 2 MG/2ML IJ SOLN
1.0000 mg | Freq: Once | INTRAMUSCULAR | Status: AC
  Administered 2011-01-24: 1 mg via INTRAVENOUS

## 2011-01-24 MED ORDER — CEFAZOLIN SODIUM 1-5 GM-% IV SOLN
INTRAVENOUS | Status: DC | PRN
  Administered 2011-01-24 (×2): 1 g via INTRAVENOUS

## 2011-01-24 MED ORDER — ENOXAPARIN SODIUM 40 MG/0.4ML ~~LOC~~ SOLN
SUBCUTANEOUS | Status: AC
  Administered 2011-01-24: 40 mg via SUBCUTANEOUS
  Filled 2011-01-24: qty 0.4

## 2011-01-24 MED ORDER — ROCURONIUM BROMIDE 100 MG/10ML IV SOLN
INTRAVENOUS | Status: DC | PRN
  Administered 2011-01-24: 15 mg via INTRAVENOUS
  Administered 2011-01-24: 35 mg via INTRAVENOUS

## 2011-01-24 MED ORDER — NEOSTIGMINE METHYLSULFATE 1 MG/ML IJ SOLN
INTRAMUSCULAR | Status: DC | PRN
  Administered 2011-01-24: 3 mg via INTRAMUSCULAR

## 2011-01-24 MED ORDER — KETOROLAC TROMETHAMINE 30 MG/ML IJ SOLN
30.0000 mg | Freq: Once | INTRAMUSCULAR | Status: AC
  Administered 2011-01-24: 30 mg via INTRAVENOUS

## 2011-01-24 MED ORDER — BUPIVACAINE HCL (PF) 0.5 % IJ SOLN
INTRAMUSCULAR | Status: DC | PRN
  Administered 2011-01-24: 10 mL

## 2011-01-24 MED ORDER — MIDAZOLAM HCL 2 MG/2ML IJ SOLN
1.0000 mg | INTRAMUSCULAR | Status: DC | PRN
  Administered 2011-01-24: 2 mg via INTRAVENOUS

## 2011-01-24 MED ORDER — KETOROLAC TROMETHAMINE 30 MG/ML IJ SOLN
INTRAMUSCULAR | Status: AC
  Filled 2011-01-24: qty 1

## 2011-01-24 MED ORDER — LACTATED RINGERS IV SOLN
INTRAVENOUS | Status: DC
  Administered 2011-01-24: 1000 mL via INTRAVENOUS

## 2011-01-24 MED ORDER — PROPOFOL 10 MG/ML IV EMUL
INTRAVENOUS | Status: AC
  Filled 2011-01-24: qty 20

## 2011-01-24 MED ORDER — POVIDONE-IODINE 10 % EX OINT
TOPICAL_OINTMENT | CUTANEOUS | Status: AC
  Filled 2011-01-24: qty 2

## 2011-01-24 SURGICAL SUPPLY — 38 items
BAG HAMPER (MISCELLANEOUS) ×2 IMPLANT
CLOTH BEACON ORANGE TIMEOUT ST (SAFETY) ×2 IMPLANT
COVER LIGHT HANDLE STERIS (MISCELLANEOUS) ×4 IMPLANT
DECANTER SPIKE VIAL GLASS SM (MISCELLANEOUS) IMPLANT
DURAPREP 26ML APPLICATOR (WOUND CARE) ×2 IMPLANT
ELECT REM PT RETURN 9FT ADLT (ELECTROSURGICAL) ×2
ELECTRODE REM PT RTRN 9FT ADLT (ELECTROSURGICAL) ×1 IMPLANT
FORMALIN 10 PREFIL 480ML (MISCELLANEOUS) IMPLANT
GLOVE BIO SURGEON STRL SZ7.5 (GLOVE) ×2 IMPLANT
GLOVE ECLIPSE 6.5 STRL STRAW (GLOVE) ×4 IMPLANT
GLOVE EXAM NITRILE MD LF STRL (GLOVE) ×2 IMPLANT
GLOVE INDICATOR 7.0 STRL GRN (GLOVE) ×4 IMPLANT
GOWN BRE IMP SLV AUR XL STRL (GOWN DISPOSABLE) ×6 IMPLANT
INST SET MAJOR GENERAL (KITS) ×2 IMPLANT
KIT ROOM TURNOVER APOR (KITS) ×2 IMPLANT
MANIFOLD NEPTUNE II (INSTRUMENTS) ×2 IMPLANT
NEEDLE HYPO 25X1 1.5 SAFETY (NEEDLE) ×2 IMPLANT
NS IRRIG 1000ML POUR BTL (IV SOLUTION) ×2 IMPLANT
PACK ABDOMINAL MAJOR (CUSTOM PROCEDURE TRAY) ×2 IMPLANT
PAD ARMBOARD 7.5X6 YLW CONV (MISCELLANEOUS) ×2 IMPLANT
PATCH VENTRAL MEDIUM 6.4 (Mesh Specialty) ×2 IMPLANT
SET BASIN LINEN APH (SET/KITS/TRAYS/PACK) ×2 IMPLANT
SPONGE GAUZE 4X4 12PLY (GAUZE/BANDAGES/DRESSINGS) ×2 IMPLANT
STAPLER VISISTAT (STAPLE) ×2 IMPLANT
SUT ETHIBOND NAB MO 7 #0 18IN (SUTURE) ×2 IMPLANT
SUT NOVA NAB GS-21 1 T12 (SUTURE) IMPLANT
SUT NOVA NAB GS-22 2 2-0 T-19 (SUTURE) IMPLANT
SUT NOVA NAB GS-26 0 60 (SUTURE) IMPLANT
SUT PROLENE 0 CT 1 CR/8 (SUTURE) ×2 IMPLANT
SUT SILK 2 0 (SUTURE)
SUT SILK 2-0 18XBRD TIE 12 (SUTURE) IMPLANT
SUT VIC AB 2-0 CT1 27 (SUTURE) ×1
SUT VIC AB 2-0 CT1 TAPERPNT 27 (SUTURE) ×1 IMPLANT
SUT VIC AB 3-0 SH 27 (SUTURE) ×1
SUT VIC AB 3-0 SH 27X BRD (SUTURE) ×1 IMPLANT
SUT VIC AB 4-0 PS2 27 (SUTURE) IMPLANT
SYR CONTROL 10ML LL (SYRINGE) ×2 IMPLANT
TAPE CLOTH SURG 4X10 WHT LF (GAUZE/BANDAGES/DRESSINGS) ×2 IMPLANT

## 2011-01-24 NOTE — Interval H&P Note (Signed)
History and Physical Interval Note:   01/24/2011   8:45 AM   Cay Schillings  has presented today for surgery, with the diagnosis of Hernia, incisional [553.21]  The various methods of treatment have been discussed with the patient and family. After consideration of risks, benefits and other options for treatment, the patient has consented to  Procedure(s): HERNIA REPAIR INCISIONAL as a surgical intervention .  I have reviewed the patients' chart and labs.  Questions were answered to the patient's satisfaction.     Dalia Heading  MD

## 2011-01-24 NOTE — Anesthesia Preprocedure Evaluation (Addendum)
Anesthesia Evaluation  Name, MR# and DOB Patient awake  General Assessment Comment  Reviewed: Allergy & Precautions, H&P , NPO status , Patient's Chart, lab work & pertinent test results  History of Anesthesia Complications Negative for: history of anesthetic complications  Airway Mallampati: II  Neck ROM: Full    Dental  (+) Teeth Intact and Poor Dentition   Pulmonary  clear to auscultation  breath sounds clear to auscultation none    Cardiovascular Regular Normal    Neuro/Psych   GI/Hepatic/Renal       PUD,       Endo/Other    Abdominal   Musculoskeletal   Hematology   Peds  Reproductive/Obstetrics    Anesthesia Other Findings             Anesthesia Physical Anesthesia Plan  ASA: II  Anesthesia Plan: General   Post-op Pain Management:    Induction: Intravenous  Airway Management Planned: Oral ETT  Additional Equipment:   Intra-op Plan:   Post-operative Plan:   Informed Consent: I have reviewed the patients History and Physical, chart, labs and discussed the procedure including the risks, benefits and alternatives for the proposed anesthesia with the patient or authorized representative who has indicated his/her understanding and acceptance.     Plan Discussed with:   Anesthesia Plan Comments:         Anesthesia Quick Evaluation

## 2011-01-24 NOTE — Op Note (Signed)
Patient:  Alison [name]  DOB:  1975/10/11  MRN:  [id number]   Preop Diagnosis:  Recurrent incisional hernia  Postop Diagnosis:  Same  Procedure:  Recurrent incisional herniorrhaphy with mesh  Surgeon:  Franky Macho, M.D.  Anes:  General endotracheal  Indications:  Patient is a 35 year old morbidly obese white female status post an incisional herniorrhaphy with mesh earlier this year who now presents with a recurrence of the hernia. The risks and benefits of the procedure including bleeding, infection, possibly recurrence of the hernia were fully explained to the patient, gave informed consent.  Procedure note:  Patient is placed the supine position. After induction of general endotracheal anesthesia, the abdomen was prepped and draped using usual sterile technique with DuraPrep. Surgical site confirmation was performed.  A longitudinal incision was made above the umbilicus. The dissection was taken down to the fascia. The patient was noted to have a hernia sac present, with the previously placed surgical mesh present medially. Appeared that she had recurred to the right of this. The old mesh was removed. In addition, a smaller umbilical based hernia was found. This was made into one hernia defect. Any omental attachments were freed away from the ventral wall. A 6.4 cm proceed ventral patch was then inserted and secured to the fascia using 0 Ethibond interrupted sutures. The overlying fascia was then reapproximated using 0 Prolene interrupted sutures. The subcutaneous layer was reapproximated using 2-0 Vicryl interrupted sutures. The skin was closed using staples. 0.5 sent Sensorcaine was instilled the surrounding wound. Betadine ointment dry sterile dressings were applied.  All tape and needle counts were correct at the end of the procedure. Patient was extubated in the operating room and went back to recovery room awake in stable condition.  Complications:  None  EBL:   Minimal  Specimen:  None

## 2011-01-24 NOTE — Anesthesia Postprocedure Evaluation (Signed)
  Anesthesia Post-op Note  Patient: Alison [name]  Procedure(s) Performed:  HERNIA REPAIR INCISIONAL - Recurrent Incisional Herniorraphy with Mesh  Patient Location: PACU  Anesthesia Type: General  Level of Consciousness: awake, alert , oriented and patient cooperative  Airway and Oxygen Therapy: Patient Spontanous Breathing and Patient connected to face mask oxygen  Post-op Pain: 9 /10, severe  Post-op Assessment: Post-op Vital signs reviewed, Patient's Cardiovascular Status Stable, Respiratory Function Stable, Patent Airway and No signs of Nausea or vomiting  Post-op Vital Signs: Reviewed and stable  Complications: No apparent anesthesia complications

## 2011-01-24 NOTE — Transfer of Care (Signed)
Immediate Anesthesia Transfer of Care Note  Patient: Alison [name]  Procedure(s) Performed:  HERNIA REPAIR INCISIONAL - Recurrent Incisional Herniorraphy with Mesh  Patient Location: PACU  Anesthesia Type: General  Level of Consciousness: awake, alert  and patient cooperative  Airway & Oxygen Therapy: Patient Spontanous Breathing and Patient connected to face mask oxygen  Post-op Assessment: Report given to PACU RN, Post -op Vital signs reviewed and stable, Patient moving all extremities and Patient moving all extremities X 4  Post vital signs: Reviewed and stable  Complications: No apparent anesthesia complications

## 2011-01-24 NOTE — Anesthesia Procedure Notes (Signed)
Procedure Name: Intubation Date/Time: 01/24/2011 9:30 AM Performed by: Despina Hidden Pre-anesthesia Checklist: Patient identified, Patient being monitored, Timeout performed, Emergency Drugs available and Suction available Patient Re-evaluated:Patient Re-evaluated prior to inductionOxygen Delivery Method: Circle System Utilized Preoxygenation: Pre-oxygenation with 100% oxygen Intubation Type: IV induction Ventilation: Mask ventilation without difficulty Grade View: Grade I Tube type: Oral Tube size: 7.0 mm Number of attempts: 1 Airway Equipment and Method: stylet Placement Confirmation: ETT inserted through vocal cords under direct vision,  breath sounds checked- equal and bilateral and positive ETCO2 Secured at: 22 cm Tube secured with: Tape Dental Injury: Teeth and Oropharynx as per pre-operative assessment

## 2011-01-28 ENCOUNTER — Encounter (HOSPITAL_COMMUNITY): Payer: Self-pay | Admitting: General Surgery

## 2011-02-01 ENCOUNTER — Encounter: Payer: Self-pay | Admitting: Gastroenterology

## 2011-02-24 ENCOUNTER — Telehealth: Payer: Self-pay | Admitting: Gastroenterology

## 2011-02-24 NOTE — Telephone Encounter (Signed)
Pt called- thinks she needs another round of antibiotics called in for her bacterial overgrowth- she is experiencing the same symptoms as before- (bloating) she can be reached at [phone]

## 2011-02-24 NOTE — Telephone Encounter (Signed)
Scheduled pt an appointment to see Lorenza Burton, NP on 03/01/2011 at 4:00 pm.

## 2011-03-01 ENCOUNTER — Ambulatory Visit: Payer: BC Managed Care – PPO | Admitting: Urgent Care

## 2011-03-11 ENCOUNTER — Telehealth: Payer: Self-pay | Admitting: Gastroenterology

## 2011-03-11 ENCOUNTER — Other Ambulatory Visit: Payer: Self-pay | Admitting: Gastroenterology

## 2011-03-11 ENCOUNTER — Encounter (HOSPITAL_COMMUNITY): Payer: Self-pay | Admitting: Pharmacy Technician

## 2011-03-11 DIAGNOSIS — K279 Peptic ulcer, site unspecified, unspecified as acute or chronic, without hemorrhage or perforation: Secondary | ICD-10-CM

## 2011-03-11 NOTE — Telephone Encounter (Signed)
moviprep

## 2011-03-11 NOTE — Telephone Encounter (Signed)
Gastroenterology Pre-Procedure Form  Request Date: 03/11/11,  Requesting Physician:      PATIENT INFORMATION:  Alison [name] is a 35 y.o., female (DOB=03-01-1976).  PROCEDURE: Procedure(s) requested: endoscopy Procedure Reason: PUD  PATIENT REVIEW QUESTIONS: The patient reports the following:   1. Diabetes Melitis: NO 2. Joint replacements in the past 12 months: no 3. Major health problems in the past 3 months: no 4. Has an artificial valve or MVP:no 5. Has been advised in past to take antibiotics in advance of a procedure like teeth cleaning: no}    MEDICATIONS & ALLERGIES:    Patient reports the following regarding taking any blood thinners:   Plavix? no Aspirin?no Coumadin?  no  Patient confirms/reports the following medications:  Current Outpatient Prescriptions  Medication Sig Dispense Refill  . acetaminophen (TYLENOL) 500 MG tablet Take 1,000 mg by mouth daily as needed. For pain       . dicyclomine (BENTYL) 20 MG tablet Take 1 tablet (20 mg total) by mouth 2 (two) times daily.  20 tablet  0  . famotidine (PEPCID) 20 MG tablet Take 1 tablet (20 mg total) by mouth 2 (two) times daily.  30 tablet  0  . HYDROcodone-acetaminophen (NORCO) 5-325 MG per tablet 1 - 2 tablets po q4hrs prn pain  40 tablet  0  . multivitamin (THERAGRAN) tablet Take 1 tablet by mouth daily.        Marland Kitchen omeprazole (PRILOSEC) 20 MG capsule Take 20 mg by mouth 2 (two) times daily.       . polycarbophil (FIBERCON) 625 MG tablet Take 625 mg by mouth 2 (two) times daily as needed. Constipation      . Probiotic Product (MISC INTESTINAL FLORA REGULAT) CHEW Chew 1 tablet by mouth at bedtime.       . Probiotic Product (PROBIOTIC FORMULA PO) Take 1 capsule by mouth daily.        . Simethicone 125 MG CAPS Take 2 capsules by mouth 2 (two) times daily.        Marland Kitchen tetrahydrozoline-zinc (VISINE-AC) 0.05-0.25 % ophthalmic solution Place 1 drop into both eyes daily as needed. Dry Eyes       . therapeutic  multivitamin-minerals (THERAGRAN-M) tablet Take 1 tablet by mouth daily.          Patient confirms/reports the following allergies:  No Known Allergies  Patient is appropriate to schedule for requested procedure(s): YES    No orders of the defined types were placed in this encounter.    SCHEDULE INFORMATION: Procedure has been scheduled as follows:  Date: 03/15/11, Time: 1:45  Location: [place] SHORT STAY   This Gastroenterology Pre-Precedure Form is being routed to the following provider(s) for review: Jonette Eva, MD

## 2011-03-14 ENCOUNTER — Telehealth: Payer: Self-pay | Admitting: Gastroenterology

## 2011-03-14 MED ORDER — SODIUM CHLORIDE 0.45 % IV SOLN
Freq: Once | INTRAVENOUS | Status: AC
Start: 2011-03-14 — End: 2011-03-15
  Administered 2011-03-15: 09:00:00 via INTRAVENOUS

## 2011-03-14 NOTE — Telephone Encounter (Signed)
pts procedure time moved up due to cancellation in schedule- she is aware of new arrival time

## 2011-03-15 ENCOUNTER — Telehealth: Payer: Self-pay | Admitting: Gastroenterology

## 2011-03-15 ENCOUNTER — Other Ambulatory Visit: Payer: Self-pay | Admitting: Gastroenterology

## 2011-03-15 ENCOUNTER — Encounter (HOSPITAL_COMMUNITY): Payer: Self-pay | Admitting: *Deleted

## 2011-03-15 ENCOUNTER — Ambulatory Visit (HOSPITAL_COMMUNITY)
Admission: RE | Admit: 2011-03-15 | Discharge: 2011-03-15 | Disposition: A | Discharge status: Home / Self Care | Payer: BC Managed Care – PPO | Source: Ambulatory Visit | Attending: Gastroenterology | Admitting: Gastroenterology

## 2011-03-15 ENCOUNTER — Encounter (HOSPITAL_COMMUNITY)
Admission: RE | Disposition: A | Discharge status: Home / Self Care | Payer: Self-pay | Source: Ambulatory Visit | Attending: Gastroenterology

## 2011-03-15 DIAGNOSIS — K2940 Chronic atrophic gastritis without bleeding: Secondary | ICD-10-CM

## 2011-03-15 DIAGNOSIS — K279 Peptic ulcer, site unspecified, unspecified as acute or chronic, without hemorrhage or perforation: Secondary | ICD-10-CM

## 2011-03-15 DIAGNOSIS — Z8711 Personal history of peptic ulcer disease: Secondary | ICD-10-CM

## 2011-03-15 DIAGNOSIS — K277 Chronic peptic ulcer, site unspecified, without hemorrhage or perforation: Secondary | ICD-10-CM | POA: Insufficient documentation

## 2011-03-15 HISTORY — PX: ESOPHAGOGASTRODUODENOSCOPY: SHX5428

## 2011-03-15 SURGERY — EGD (ESOPHAGOGASTRODUODENOSCOPY)
Anesthesia: Moderate Sedation

## 2011-03-15 MED ORDER — STERILE WATER FOR IRRIGATION IR SOLN
Status: DC | PRN
  Administered 2011-03-15: 09:00:00

## 2011-03-15 MED ORDER — MEPERIDINE HCL 100 MG/ML IJ SOLN
INTRAMUSCULAR | Status: DC | PRN
  Administered 2011-03-15: 50 mg via INTRAVENOUS
  Administered 2011-03-15: 25 mg via INTRAVENOUS
  Administered 2011-03-15: 50 mg via INTRAVENOUS
  Administered 2011-03-15: 25 mg via INTRAVENOUS

## 2011-03-15 MED ORDER — CIPROFLOXACIN HCL 500 MG PO TABS: ORAL_TABLET | ORAL | Status: AC

## 2011-03-15 MED ORDER — MEPERIDINE HCL 100 MG/ML IJ SOLN
INTRAMUSCULAR | Status: AC
  Filled 2011-03-15: qty 1

## 2011-03-15 MED ORDER — MIDAZOLAM HCL 5 MG/5ML IJ SOLN
INTRAMUSCULAR | Status: DC | PRN
  Administered 2011-03-15 (×2): 2 mg via INTRAVENOUS
  Administered 2011-03-15: 1 mg via INTRAVENOUS
  Administered 2011-03-15: 2 mg via INTRAVENOUS

## 2011-03-15 MED ORDER — MIDAZOLAM HCL 5 MG/5ML IJ SOLN
INTRAMUSCULAR | Status: AC
  Filled 2011-03-15: qty 5

## 2011-03-15 MED ORDER — METRONIDAZOLE 500 MG PO TABS: ORAL_TABLET | ORAL | Status: AC

## 2011-03-15 MED ORDER — BUTAMBEN-TETRACAINE-BENZOCAINE 2-2-14 % EX AERO
INHALATION_SPRAY | CUTANEOUS | Status: DC | PRN
  Administered 2011-03-15: 2 via TOPICAL

## 2011-03-15 NOTE — H&P (Signed)
Primary Care Physician:  Criss Rosales, MD Primary Gastroenterologist:  Dr. Darrick Penna  Pre-Procedure History & Physical: HPI:  Alison [name] is a 35 y.o. female here for EGD TO CONFIRM HEALING OF PUD  Past Medical History  Diagnosis Date  . Bloating JAN 2012    SEP 2012 HBT C/W SIBO(1ST pk 15, 2nd pk 21)  . Ulcer     Past Surgical History  Procedure Date  . Cesarean section X2  . Cholecystectomy APR 2012 [facility]    BILIARY DYSKINESIA, path: CHRONIC CHOLECYSTITIS  . Tubal ligation   . Esophagogastroduodenoscopy 12/03/2010    Procedure: ESOPHAGOGASTRODUODENOSCOPY (EGD);  Surgeon: Arlyce Harman, MD;  Location: [facility];  Service: Endoscopy;  Laterality: N/A;  . Incisional hernia repair 12/13/2010    Procedure: HERNIA REPAIR INCISIONAL;  Surgeon: Dalia Heading;  Location: [facility];  Service: General;  Laterality: N/A;  with mesh  . Hernia repair   . Hydrogen breath test 01/12/2011    Procedure: HYDROGEN BREATH TEST;  Surgeon: Arlyce Harman, MD;  Location: [facility];  Service: Endoscopy;  Laterality: N/A;  7:30am  . Incisional hernia repair 01/24/2011    Procedure: HERNIA REPAIR INCISIONAL;  Surgeon: Dalia Heading;  Location: [facility];  Service: General;  Laterality: N/A;  Recurrent Incisional Herniorraphy with Mesh    Prior to Admission medications   Medication Sig Start Date End Date Taking? Authorizing Provider  multivitamin [facility]) tablet Take 1 tablet by mouth daily.     Yes Historical Provider, MD  omeprazole (PRILOSEC) 20 MG capsule Take 20 mg by mouth 2 (two) times daily.  12/03/10  Yes Arlyce Harman, MD  Probiotic Product (PROBIOTIC FORMULA PO) Take 1 capsule by mouth daily.     Yes Historical Provider, MD  Simethicone 125 MG CAPS Take 2 capsules by mouth 2 (two) times daily.     Yes Historical Provider, MD  tetrahydrozoline-zinc (VISINE-AC) 0.05-0.25 % ophthalmic solution Place 1 drop into both eyes daily as needed. Dry Eyes    Yes Historical Provider, MD     Allergies as of 03/11/2011  . (No Known Allergies)    Family History  Problem Relation Age of Onset  . Hyperlipidemia Mother   . Anesthesia problems Neg Hx   . Hypotension Neg Hx   . Malignant hyperthermia Neg Hx   . Pseudochol deficiency Neg Hx   . Colon cancer Neg Hx     History   Social History  . Marital Status: Married    Spouse Name: N/A    Number of Children: N/A  . Years of Education: N/A   Occupational History  . Not on file.   Social History Main Topics  . Smoking status: Current Everyday Smoker -- 0.5 packs/day for 10 years    Types: Cigarettes  . Smokeless tobacco: Never Used   Comment: 10 cigarettes daily  . Alcohol Use: No  . Drug Use: No  . Sexually Active: Yes    Birth Control/ Protection: None, Surgical   Other Topics Concern  . Not on file   Social History Narrative  . No narrative on file    Review of Systems: See HPI, otherwise negative ROS   Physical Exam: BP 152/98  Pulse 87  Temp(Src) 98 F (36.7 C) (Oral)  Resp 20  Ht 5\' 7"  (1.702 m)  Wt 240 lb (108.863 kg)  BMI 37.59 kg/m2  SpO2 100%  LMP 03/01/2011 General:   Alert,  pleasant and cooperative in NAD Head:  Normocephalic and atraumatic. Neck:  Supple; no masses or thyromegaly. Lungs:  Clear throughout to auscultation.    Heart:  Regular rate and rhythm. Abdomen:  Soft, nontender and nondistended. Normal bowel sounds, without guarding, and without rebound.   Neurologic:  Alert and  oriented x4;  grossly normal neurologically.  Impression/Plan:   Hx:PUD  PLAN: REPEAT EGD TO ASSESS HEALING

## 2011-03-15 NOTE — Telephone Encounter (Signed)
THIS IS ON WRONG PATIENT PER PAM SHREVE, RN

## 2011-03-15 NOTE — Telephone Encounter (Signed)
Cliffton Asters, RN for short stay called to let us know pt will need a barium pill esophgram done this week and to schedule a repeat EGD/ED in 2 weeks.

## 2011-03-17 ENCOUNTER — Telehealth: Payer: Self-pay | Admitting: Gastroenterology

## 2011-03-17 NOTE — Telephone Encounter (Signed)
Called, lmom ok to take the antibiotics.

## 2011-03-17 NOTE — Telephone Encounter (Signed)
Taking Cipro an Flagil and she is asking could either one of those mess ur periods up she has started her period again please advise??

## 2011-03-17 NOTE — Telephone Encounter (Signed)
Please call pt. The Abx do not affect her cycle.

## 2011-03-21 ENCOUNTER — Telehealth: Payer: Self-pay | Admitting: Gastroenterology

## 2011-03-21 NOTE — Telephone Encounter (Signed)
Please call pt. HER stomach Bx shows mild gastritis. Continue OMP 30 minutes prior to meals TWICE A DAY. [facility] FEB 2013.

## 2011-03-22 NOTE — Telephone Encounter (Signed)
Reminder in epic to follow up in Feb 2013 °

## 2011-03-22 NOTE — Telephone Encounter (Signed)
Pt informed

## 2011-03-23 NOTE — Telephone Encounter (Signed)
Results Cc to PCP  

## 2011-03-24 ENCOUNTER — Encounter (HOSPITAL_COMMUNITY): Payer: Self-pay | Admitting: Gastroenterology

## 2011-05-10 HISTORY — PX: INCISIONAL HERNIA REPAIR: SHX193

## 2011-05-24 ENCOUNTER — Other Ambulatory Visit (HOSPITAL_COMMUNITY)
Admission: RE | Admit: 2011-05-24 | Discharge: 2011-05-24 | Disposition: A | Discharge status: Home / Self Care | Payer: BC Managed Care – PPO | Source: Ambulatory Visit | Attending: Obstetrics & Gynecology | Admitting: Obstetrics & Gynecology

## 2011-05-24 ENCOUNTER — Other Ambulatory Visit: Payer: Self-pay | Admitting: Obstetrics & Gynecology

## 2011-05-24 DIAGNOSIS — Z01419 Encounter for gynecological examination (general) (routine) without abnormal findings: Secondary | ICD-10-CM | POA: Insufficient documentation

## 2011-07-06 ENCOUNTER — Ambulatory Visit (INDEPENDENT_AMBULATORY_CARE_PROVIDER_SITE_OTHER): Payer: BC Managed Care – PPO | Admitting: Gastroenterology

## 2011-07-06 ENCOUNTER — Encounter: Payer: Self-pay | Admitting: Gastroenterology

## 2011-07-06 DIAGNOSIS — R195 Other fecal abnormalities: Secondary | ICD-10-CM

## 2011-07-06 DIAGNOSIS — R140 Abdominal distension (gaseous): Secondary | ICD-10-CM

## 2011-07-06 DIAGNOSIS — K279 Peptic ulcer, site unspecified, unspecified as acute or chronic, without hemorrhage or perforation: Secondary | ICD-10-CM

## 2011-07-06 DIAGNOSIS — R141 Gas pain: Secondary | ICD-10-CM

## 2011-07-06 MED ORDER — DICYCLOMINE HCL 10 MG PO CAPS: ORAL_CAPSULE | ORAL | Status: DC

## 2011-07-06 MED ORDER — OMEPRAZOLE 20 MG PO CPDR: DELAYED_RELEASE_CAPSULE | ORAL | Status: DC

## 2011-07-06 NOTE — Assessment & Plan Note (Addendum)
MOST LIKELY DUE TO IBS +/- SIBO.  USE DICYCLOMINE 30 MINUTES PRIOR TO BREAKFAST AND SUPPER. REDUCE TO ONCE A DAY IF YOU HAVE HARD STOOLS OR CONSTIPATION. MAY CAUSE DRY MOUTH/EYES, DROWSINESS, CONSTIPATION, OR PROBLEMS SWALLOWING. CONTINUE A PROBIOTIC DAILY. FOLLOW UP IN 4 MOS.

## 2011-07-06 NOTE — Progress Notes (Signed)
Faxed to PCP

## 2011-07-06 NOTE — Assessment & Plan Note (Signed)
IMPROVED

## 2011-07-06 NOTE — Patient Instructions (Addendum)
USE DICYCLOMINE 30 MINUTES PRIOR TO BREAKFAST AND SUPPER. REDUCE TO ONCE A DAY IF YOU HAVE HARD STOOLS OR CONSTIPATION. IT MAY CAUSE DRY MOUTH/EYES, DROWSINESS, CONSTIPATION, OR PROBLEMS SWALLOWING. CONTINUE A PROBIOTIC & OMEPRAZOLE DAILY FOLLOW UP IN 4 MOS.

## 2011-07-06 NOTE — Progress Notes (Signed)
Subjective:    Patient ID: Alison [name], female    DOB: [birth year], 36 y.o.   MRN: [id number]  PCP: [facility]  HPI 1ST TIME IT HELPED. 2ND TIME: TRIED CIP/FLAG AND IT MADE HER FELL SICK. BLAOTING MAY HAVE COME FROM HERNIA. HAD HERNIA FIXED #3 JAN 16. BMs: ONCE A DAY. CERTAIN FOODS CAUSE DIARRHEA. No spicy foods.  Doesn't seem to be a rhyme or reason. Taking [employer] brand probiotic since SEP 2012. Loose stool max: 3x/week. Eats and within 15 mins runs to BR-1-2 times then goes away. Has not tried Levsin or dicyclomine to try to regulate BMs.   Past Medical History  Diagnosis Date  . Bloating JAN 2012    SEP 2012 HBT C/W SIBO(1ST pk 15, 2nd pk 21)  . Ulcer     Past Surgical History  Procedure Date  . Cesarean section X2  . Cholecystectomy APR 2012 [facility]    BILIARY DYSKINESIA, path: CHRONIC CHOLECYSTITIS  . Tubal ligation   . Esophagogastroduodenoscopy 12/03/2010    Procedure: ESOPHAGOGASTRODUODENOSCOPY (EGD);  Surgeon: Arlyce Harman, MD;  [facility];  Service: Endoscopy;  Laterality: N/A;  . Incisional hernia repair 12/13/2010    Procedure: HERNIA REPAIR INCISIONAL;  Surgeon: Dalia Heading;  Location: [facility];  Service: General;  Laterality: N/A;  with mesh  . Hernia repair   . Hydrogen breath test 01/12/2011    Procedure: HYDROGEN BREATH TEST;  Surgeon: Arlyce Harman, MD;  Location: [facility];  Service: Endoscopy;  Laterality: N/A;  7:30am  . Incisional hernia repair 01/24/2011    Procedure: HERNIA REPAIR INCISIONAL;  Surgeon: Dalia Heading;  Location: [facility];  Service: General;  Laterality: N/A;  Recurrent Incisional Herniorraphy with Mesh  . Esophagogastroduodenoscopy 03/15/2011    Procedure: ESOPHAGOGASTRODUODENOSCOPY (EGD);  Surgeon: Arlyce Harman, MD;  Location: [facility];  Service: Endoscopy;  Laterality: N/A;  1:45   No Known Allergies Current Outpatient Prescriptions  Medication Sig Dispense Refill  . lisinopril (PRINIVIL,ZESTRIL) 20 MG tablet Take  20 mg by mouth daily.       . multivitamin (THERAGRAN) tablet Take 1 tablet by mouth daily.      Marland Kitchen omeprazole (PRILOSEC) 20 MG capsule 1 PO BID    . Probiotic Product (PROBIOTIC FORMULA PO) Take 1 capsule by mouth daily.      . Simethicone 125 MG CAPS Take 2 capsules by mouth 2 (two) times daily.      . SPRINTEC 28 0.25-35 MG-MCG tablet Take 1 tablet by mouth daily.     Marland Kitchen tetrahydrozoline-zinc (VISINE-AC) 0.05-0.25 % ophthalmic solution Place 1 drop into both eyes daily as needed. Dry Eyes     .           Review of Systems     Objective:   Physical Exam  Vitals reviewed. Constitutional: She is oriented to person, place, and time. She appears well-developed and well-nourished. No distress.  HENT:  Head: Normocephalic and atraumatic.  Mouth/Throat: Oropharynx is clear and moist. No oropharyngeal exudate.  Neck: Normal range of motion. Neck supple.  Cardiovascular: Normal rate, regular rhythm and normal heart sounds.   Pulmonary/Chest: Effort normal and breath sounds normal.  Abdominal: Soft. Bowel sounds are normal. She exhibits no distension (MILD LUQ TTP). There is tenderness. There is no rebound and no guarding.  Musculoskeletal: Normal range of motion. She exhibits no edema.  Neurological: She is alert and oriented to person, place, and time.       NO  FOCAL DEFICITS           Assessment & Plan:

## 2011-07-06 NOTE — Progress Notes (Signed)
Reminder in epic to follow up in 4 months °

## 2011-07-06 NOTE — Assessment & Plan Note (Signed)
CONTINUE A OMEPRAZOLE DAILY.

## 2011-10-14 ENCOUNTER — Encounter: Payer: Self-pay | Admitting: Gastroenterology

## 2011-11-24 ENCOUNTER — Ambulatory Visit (INDEPENDENT_AMBULATORY_CARE_PROVIDER_SITE_OTHER): Payer: BC Managed Care – PPO | Admitting: Gastroenterology

## 2011-11-24 ENCOUNTER — Encounter: Payer: Self-pay | Admitting: Gastroenterology

## 2011-11-24 VITALS — BP 124/88 | HR 92 | Temp 98.0°F | Ht 66.0 in | Wt 239.8 lb

## 2011-11-24 DIAGNOSIS — R195 Other fecal abnormalities: Secondary | ICD-10-CM

## 2011-11-24 MED ORDER — DICYCLOMINE HCL 10 MG PO CAPS: ORAL_CAPSULE | ORAL | Status: DC

## 2011-11-24 NOTE — Progress Notes (Signed)
Faxed to PCP, Dr Dimas Aguas

## 2011-11-24 NOTE — Patient Instructions (Addendum)
USE BENTYL 20 MG 30 MINUTES PRIOR TO BREAKFAST AND LUNCH TO PREVENT DIARRHEA/FRQUENT LOOSE STOOLS AFTER EATING.  TAKE A PROBIOTIC DAILY FOREVER.   FOLLOW A LOW FAT SOFT/LOW DAIRY DIET. SEE INFO BELOW.  FOLLOW UP IN 4 MOS.    Lactose Free Diet Lactose is a carbohydrate that is found mainly in milk and milk products, as well as in foods with added milk or whey. Lactose must be digested by the enzyme in order to be used by the body. Lactose intolerance occurs when there is a shortage of lactase. When your body is not able to digest lactose, you may feel sick to your stomach (nausea), bloating, cramping, gas and diarrhea.  There are many dairy products that may be tolerated better than milk by some people:  The use of cultured dairy products such as yogurt, buttermilk, cottage cheese, and sweet acidophilus milk (Kefir) for lactase-deficient individuals is usually well tolerated. This is because the healthy bacteria help digest lactose.   Lactose-hydrolyzed milk (Lactaid) contains 40-90% less lactose than milk and may also be well tolerated.    SPECIAL NOTES  Lactose is a carbohydrates. The major food source is dairy products. Reading food labels is important. Many products contain lactose even when they are not made from milk. Look for the following words: whey, milk solids, dry milk solids, nonfat dry milk powder. Typical sources of lactose other than dairy products include breads, candies, cold cuts, prepared and processed foods, and commercial sauces and gravies.   All foods must be prepared without milk, cream, or other dairy foods.   Soy milk and lactose-free supplements (LACTASE) may be used as an alternative to milk.   FOOD GROUP ALLOWED/RECOMMENDED AVOID/USE SPARINGLY  BREADS / STARCHES 4 servings or more* Breads and rolls made without milk. [place], [place], or [place] bread. Breads and rolls that contain milk. Prepared mixes such as muffins, biscuits, waffles, pancakes. Sweet  rolls, donuts, [place] toast (if made with milk or lactose).  Crackers: Soda crackers, graham crackers. Any crackers prepared without lactose. Zwieback crackers, corn curls, or any that contain lactose.  Cereals: Cooked or dry cereals prepared without lactose (read labels). Cooked or dry cereals prepared with lactose (read labels). Total, Cocoa Krispies. Special K.  Potatoes / Pasta / Rice: Any prepared without milk or lactose. Popcorn. Instant potatoes, frozen [place] fries, scalloped or au gratin potatoes.  VEGETABLES 2 servings or more Fresh, frozen, and canned vegetables. Creamed or breaded vegetables. Vegetables in a cheese sauce or with lactose-containing margarines.  FRUIT 2 servings or more All fresh, canned, or frozen fruits that are not processed with lactose. Any canned or frozen fruits processed with lactose.  MEAT & SUBSTITUTES 2 servings or more (4 to 6 oz. total per day) Plain beef, chicken, fish, [place], lamb, veal, pork, or ham. Kosher prepared meat products. Strained or junior meats that do not contain milk. Eggs, soy meat substitutes, nuts. Scrambled eggs, omelets, and souffles that contain milk. Creamed or breaded meat, fish, or fowl. Sausage products such as wieners, liver sausage, or cold cuts that contain milk solids. Cheese, cottage cheese, or cheese spreads.  MILK None. (See "BEVERAGES" for milk substitutes. See "DESSERTS" for ice cream and frozen desserts.) Milk (whole, 2%, skim, or chocolate). Evaporated, powdered, or condensed milk; malted milk.  SOUPS & COMBINATION FOODS Bouillon, broth, vegetable soups, clear soups, consomms. Homemade soups made with allowed ingredients. Combination or prepared foods that do not contain milk or milk products (read labels). Cream soups, chowders, commercially prepared  soups containing lactose. Macaroni and cheese, pizza. Combination or prepared foods that contain milk or milk products.  DESSERTS & SWEETS In moderation Water and fruit  ices; gelatin; angel food cake. Homemade cookies, pies, or cakes made from allowed ingredients. Pudding (if made with water or a milk substitute). Lactose-free tofu desserts. Sugar, honey, corn syrup, jam, jelly; marmalade; molasses (beet sugar); Pure sugar candy; marshmallows. Ice cream, ice milk, sherbet, custard, pudding, frozen yogurt. Commercial cake and cookie mixes. Desserts that contain chocolate. Pie crust made with milk-containing margarine; reduced-calorie desserts made with a sugar substitute that contains lactose. Toffee, peppermint, butterscotch, chocolate, caramels.  FATS & OILS In moderation Butter (as tolerated; contains very small amounts of lactose). Margarines and dressings that do not contain milk, Vegetable oils, shortening, Miracle Whip, mayonnaise, nondairy cream & whipped toppings without lactose or milk solids added (examples: Coffee Rich, Carnation Coffeemate, Rich's Whipped Topping, PolyRich). Tomasa Blase. Margarines and salad dressings containing milk; cream, cream cheese; peanut butter with added milk solids, sour cream, chip dips, made with sour cream.  BEVERAGES Carbonated drinks; tea; coffee and freeze-dried coffee; some instant coffees (check labels). Fruit drinks; fruit and vegetable juice; Rice or Soy milk. Ovaltine, hot chocolate. Some cocoas; some instant coffees; instant iced teas; powdered fruit drinks (read labels).   CONDIMENTS / MISCELLANEOUS Soy sauce, carob powder, olives, gravy made with water, baker's cocoa, pickles, pure seasonings and spices, wine, pure monosodium glutamate, catsup, mustard. Some chewing gums, chocolate, some cocoas. Certain antibiotics and vitamin / mineral preparations. Spice blends if they contain milk products. MSG extender. Artificial sweeteners that contain lactose such as Equal (Nutra-Sweet) and Sweet 'n Low. Some nondairy creamers (read labels).   SAMPLE MENU*  Breakfast   Orange Juice.  Banana.   Bran flakes.   Nondairy  Creamer.  Vienna Bread (toasted).   Butter or milk-free margarine.   Coffee or tea.    Noon Meal   Chicken Breast.  Rice.   Green beans.   Butter or milk-free margarine.  Fresh melon.   Coffee or tea.    Evening Meal   Roast Beef.  Baked potato.   Butter or milk-free margarine.   Broccoli.   Lettuce salad with vinegar and oil dressing.  [employer].   Coffee or tea.      Low-Fat Diet BREADS, CEREALS, PASTA, RICE, DRIED PEAS, AND BEANS These products are high in carbohydrates and most are low in fat. Therefore, they can be increased in the diet as substitutes for fatty foods. They too, however, contain calories and should not be eaten in excess. Cereals can be eaten for snacks as well as for breakfast.   FRUITS AND VEGETABLES It is good to eat fruits and vegetables. Besides being sources of fiber, both are rich in vitamins and some minerals. They help you get the daily allowances of these nutrients. Fruits and vegetables can be used for snacks and desserts.  MEATS Limit lean meat, chicken, [place], and fish to no more than 6 ounces per day. Beef, Pork, and Lamb Use lean cuts of beef, pork, and lamb. Lean cuts include:  Extra-lean ground beef.  Arm roast.  Sirloin tip.  Center-cut ham.  Round steak.  Loin chops.  Rump roast.  Tenderloin.  Trim all fat off the outside of meats before cooking. It is not necessary to severely decrease the intake of red meat, but lean choices should be made. Lean meat is rich in protein and contains a highly absorbable form of iron. Premenopausal women,  in particular, should avoid reducing lean red meat because this could increase the risk for low red blood cells (iron-deficiency anemia).  Chicken and [place] These are good sources of protein. The fat of poultry can be reduced by removing the skin and underlying fat layers before cooking. Chicken and [place] can be substituted for lean red meat in the diet. Poultry should not be  fried or covered with high-fat sauces. Fish and Shellfish Fish is a good source of protein. Shellfish contain cholesterol, but they usually are low in saturated fatty acids. The preparation of fish is important. Like chicken and [place], they should not be fried or covered with high-fat sauces. EGGS Egg whites contain no fat or cholesterol. They can be eaten often. Try 1 to 2 egg whites instead of whole eggs in recipes or use egg substitutes that do not contain yolk. MILK AND DAIRY PRODUCTS Use skim or 1% milk instead of 2% or whole milk. Decrease whole milk, natural, and processed cheeses. Use nonfat or low-fat (2%) cottage cheese or low-fat cheeses made from vegetable oils. Choose nonfat or low-fat (1 to 2%) yogurt. Experiment with evaporated skim milk in recipes that call for heavy cream. Substitute low-fat yogurt or low-fat cottage cheese for sour cream in dips and salad dressings. Have at least 2 servings of low-fat dairy products, such as 2 glasses of skim (or 1%) milk each day to help get your daily calcium intake. FATS AND OILS Reduce the total intake of fats, especially saturated fat. Butterfat, lard, and beef fats are high in saturated fat and cholesterol. These should be avoided as much as possible. Vegetable fats do not contain cholesterol, but certain vegetable fats, such as coconut oil, palm oil, and palm kernel oil are very high in saturated fats. These should be limited. These fats are often used in bakery goods, processed foods, popcorn, oils, and nondairy creamers. Vegetable shortenings and some peanut butters contain hydrogenated oils, which are also saturated fats. Read the labels on these foods and check for saturated vegetable oils. Unsaturated vegetable oils and fats do not raise blood cholesterol. However, they should be limited because they are fats and are high in calories. Total fat should still be limited to 30% of your daily caloric intake. Desirable liquid vegetable oils are  corn oil, cottonseed oil, olive oil, canola oil, safflower oil, soybean oil, and sunflower oil. Peanut oil is not as good, but small amounts are acceptable. Buy a heart-healthy tub margarine that has no partially hydrogenated oils in the ingredients. Mayonnaise and salad dressings often are made from unsaturated fats, but they should also be limited because of their high calorie and fat content. Seeds, nuts, peanut butter, olives, and avocados are high in fat, but the fat is mainly the unsaturated type. These foods should be limited mainly to avoid excess calories and fat. OTHER EATING TIPS Snacks  Most sweets should be limited as snacks. They tend to be rich in calories and fats, and their caloric content outweighs their nutritional value. Some good choices in snacks are graham crackers, melba toast, soda crackers, bagels (no egg), English muffins, fruits, and vegetables. These snacks are preferable to snack crackers, [place] fries, TORTILLA CHIPS, and POTATO chips. Popcorn should be air-popped or cooked in small amounts of liquid vegetable oil. Desserts Eat fruit, low-fat yogurt, and fruit ices instead of pastries, cake, and cookies. Sherbet, angel food cake, gelatin dessert, frozen low-fat yogurt, or other frozen products that do not contain saturated fat (pure fruit juice bars,  frozen ice pops) are also acceptable.  COOKING METHODS Choose those methods that use little or no fat. They include: Poaching.  Braising.  Steaming.  Grilling.  Baking.  Stir-frying.  Broiling.  Microwaving.  Foods can be cooked in a nonstick pan without added fat, or use a nonfat cooking spray in regular cookware. Limit fried foods and avoid frying in saturated fat. Add moisture to lean meats by using water, broth, cooking wines, and other nonfat or low-fat sauces along with the cooking methods mentioned above. Soups and stews should be chilled after cooking. The fat that forms on top after a few hours in the  refrigerator should be skimmed off. When preparing meals, avoid using excess salt. Salt can contribute to raising blood pressure in some people.  EATING AWAY FROM HOME Order entres, potatoes, and vegetables without sauces or butter. When meat exceeds the size of a deck of cards (3 to 4 ounces), the rest can be taken home for another meal. Choose vegetable or fruit salads and ask for low-calorie salad dressings to be served on the side. Use dressings sparingly. Limit high-fat toppings, such as bacon, crumbled eggs, cheese, sunflower seeds, and olives. Ask for heart-healthy tub margarine instead of butter.

## 2011-11-24 NOTE — Assessment & Plan Note (Signed)
MOST LIKELY DUE TO IBS, BILE SALT, & SIBO. NO Sx IMPROVEMENT WITH abx.   INCREASE BENTYL TO 20 MG 30 MINUTES PRIOR TO BREAKFAST AND LUNCH TO PREVENT DIARRHEA/FRQUENT LOOSE STOOLS AFTER EATING.  TAKE A PROBIOTIC DAILY FOREVER.   FOLLOW A LOW FAT SOFT/LOW DAIRY DIET. SEE INFO BELOW.  FOLLOW UP IN 4 MOS.

## 2011-11-24 NOTE — Progress Notes (Signed)
Subjective:    Patient ID: Alison [name], female    DOB: 1975-10-22, 36 y.o.   MRN: [id number]  PCP: [facility]  HPI HAVING RECTAL URGENCY SEVERAL TIMES A WEEK. HAS FREQUENT CLOSE CALLS AND 1 ACCIDENT/MONTH. TRIED TO EAT BETTER. EATING LESS FAT. CHEESE: 2X/WEEK. NO BLOOD IN STOOL. APPETITE: GOOD. BLOATING: GOOD AND BAD DAYS. SEP 2012: HBT C/W SIBO TOOK ABX AND FELT BETTER. 2ND TIMES THEY MADE HER FEEL SICK. NO PROBLEMS WITH HER HERNIA.  FEB 2013: 242 LBS  Past Medical History  Diagnosis Date  . Bloating JAN 2012    SEP 2012 HBT C/W SIBO(1ST pk 15, 2nd pk 21)  . Ulcer   . Bacterial overgrowth syndrome 2012    Past Surgical History  Procedure Date  . Cesarean section X2  . Cholecystectomy APR 2012 [facility]    BILIARY DYSKINESIA, path: CHRONIC CHOLECYSTITIS  . Tubal ligation   . Esophagogastroduodenoscopy 12/03/2010    Procedure: ESOPHAGOGASTRODUODENOSCOPY (EGD);  Surgeon: Arlyce Harman, MD;  Location: [facility];  Service: Endoscopy;  Laterality: N/A;  . Incisional hernia repair 12/13/2010    Procedure: HERNIA REPAIR INCISIONAL;  Surgeon: Dalia Heading;  Location: [facility];  Service: General;  Laterality: N/A;  with mesh  . Hernia repair   . Hydrogen breath test 01/12/2011    Procedure: HYDROGEN BREATH TEST;  Surgeon: Arlyce Harman, MD;  Location: [facility];  Service: Endoscopy;  Laterality: N/A;  7:30am  . Incisional hernia repair 01/24/2011    Procedure: HERNIA REPAIR INCISIONAL;  Surgeon: Dalia Heading;  Location: [facility];  Service: General;  Laterality: N/A;  Recurrent Incisional Herniorraphy with Mesh  . Esophagogastroduodenoscopy 03/15/2011    Procedure: ESOPHAGOGASTRODUODENOSCOPY (EGD);  Surgeon: Arlyce Harman, MD;  Location: [facility];  Service: Endoscopy;  Laterality: N/A;  1:45    No Known Allergies  Current Outpatient Prescriptions  Medication Sig Dispense Refill  . dicyclomine (BENTYL) 10 MG capsule 1 PO 30 MINUTES PRIOR TO BREAKFAST     . metoprolol  succinate (TOPROL-XL) 25 MG 24 hr tablet Take 25 mg by mouth daily.    . multivitamin (THERAGRAN) tablet Take 1 tablet by mouth daily.      Marland Kitchen omeprazole (PRILOSEC) 20 MG capsule 1 PO EVERY MORNING    . Probiotic Product (PROBIOTIC FORMULA PO) [facility] Take 1 capsule by mouth daily.        Marland Kitchen tetrahydrozoline-zinc (VISINE-AC) 0.05-0.25 % ophthalmic solution Place 1 drop into both eyes daily as needed. Dry Eyes       . lisinopril (PRINIVIL,ZESTRIL) 20 MG tablet Take 20 mg by mouth daily.       . Simethicone 125 MG CAPS Take 2 capsules by mouth 2 (two) times daily MAY 2X/MONTH      . SPRINTEC 28 0.25-35 MG-MCG tablet Take 1 tablet by mouth daily.           Review of Systems 239 LBS TODAY   Objective:   Physical Exam  Constitutional: She is oriented to person, place, and time. She appears well-nourished. No distress.  HENT:  Head: Normocephalic and atraumatic.  Mouth/Throat: Oropharynx is clear and moist. No oropharyngeal exudate.  Eyes: Pupils are equal, round, and reactive to light. No scleral icterus.  Neck: Normal range of motion. Neck supple.  Cardiovascular: Normal rate, regular rhythm and normal heart sounds.   Pulmonary/Chest: Effort normal and breath sounds normal. No respiratory distress.  Abdominal: Soft. Bowel sounds are normal. She exhibits no distension. There is no  tenderness. There is no rebound.  Musculoskeletal: Normal range of motion. She exhibits no edema.  Neurological: She is alert and oriented to person, place, and time.       NO FOCAL DEFICITS   Psychiatric:       FLAT AFFECT, NL MOOD          Assessment & Plan:

## 2011-12-16 NOTE — Progress Notes (Signed)
Reminder in epic to follow up in 4 months with extender per SF °

## 2012-04-02 ENCOUNTER — Encounter: Payer: Self-pay | Admitting: *Deleted

## 2012-05-22 ENCOUNTER — Encounter: Payer: Self-pay | Admitting: Urgent Care

## 2012-05-22 ENCOUNTER — Ambulatory Visit (INDEPENDENT_AMBULATORY_CARE_PROVIDER_SITE_OTHER): Payer: BC Managed Care – PPO | Admitting: Urgent Care

## 2012-05-22 VITALS — BP 128/88 | HR 80 | Temp 97.4°F | Ht 66.0 in | Wt 250.0 lb

## 2012-05-22 DIAGNOSIS — R141 Gas pain: Secondary | ICD-10-CM

## 2012-05-22 DIAGNOSIS — R197 Diarrhea, unspecified: Secondary | ICD-10-CM

## 2012-05-22 DIAGNOSIS — R140 Abdominal distension (gaseous): Secondary | ICD-10-CM

## 2012-05-22 DIAGNOSIS — K279 Peptic ulcer, site unspecified, unspecified as acute or chronic, without hemorrhage or perforation: Secondary | ICD-10-CM

## 2012-05-22 MED ORDER — OMEPRAZOLE 20 MG PO CPDR: DELAYED_RELEASE_CAPSULE | ORAL | Status: DC

## 2012-05-22 MED ORDER — CHOLESTYRAMINE 4 GM/DOSE PO POWD: 2.0000 g | Freq: Every day | ORAL | Status: DC

## 2012-05-22 NOTE — Assessment & Plan Note (Addendum)
Trial Questran for bile salt-induced diarrhea since diarrhea started directly post cholecystectomy.   Hx SBBO.  Cannot r/o underlying IBS.  If persists, colonoscopy with random biopsies would be next step.  Begin Questran 2 grams not within 2 hrs of other medications for diarrhea Increase omeprazole to 20mg  before breakfast & dinner as needed Continue probiotics daily Continue bentyl as directed Office visit in 6 mo with Dr Darrick Penna, call sooner if needed

## 2012-05-22 NOTE — Patient Instructions (Addendum)
Begin Questran 2 grams not within 2 hrs of other medications for diarrhea Increase omeprazole to 20mg  before breakfast & dinner as needed Continue probiotics daily Continue bentyl as directed

## 2012-05-22 NOTE — Progress Notes (Signed)
Faxed to PCP

## 2012-05-22 NOTE — Progress Notes (Signed)
Referring Provider: Selinda Flavin, MD Primary Care Physician:  Selinda Flavin, MD Primary Gastroenterologist:  Dr. Jonette Eva  Chief Complaint  Patient presents with  . Follow-up    diarrhea, bloating, hx PUD    HPI:  Alison [name] is a 37 y.o. female here for follow up for diarrhea, bloating & hx PUD.  She has been taking Dicyclomine 2 pills PRN.  Hx SBBO s/p treatment.  Her diarrhea is at baseline.  She has tried Fiber without any help.  She is taking [facility] probiotics.  She continues to have diarrhea about 70% days of the month.  She still has post-prandial bloating.  Her symptoms began after cholecystectomy 2 yrs ago.  She has 3-4 loose stools per day.  Denies rectal bleeding or melena.  No particular foods cause symptoms.  She is seeing an ENT, Dr Andrey Campanile in [place], [place], for sinusitis.  She just finished augmentin 875mg  for 15 days.  She did not notice a change in her diarrhea with the antibiotic.  She is undergoing blood tests for allergies with Dr Andrey Campanile.  Appetite ok.  Weight stable.  She take omeprazole 20mg  daily, but feels she needs it BID most days for heartburn & indigestion.  Past Medical History  Diagnosis Date  . Bloating JAN 2012    SEP 2012 HBT C/W SIBO(1ST pk 15, 2nd pk 21)  . Ulcer 12/03/10  . Bacterial overgrowth syndrome 2012    Past Surgical History  Procedure Date  . Cesarean section X2  . Cholecystectomy APR 2012 [facility]    BILIARY DYSKINESIA, path: CHRONIC CHOLECYSTITIS  . Tubal ligation   . Esophagogastroduodenoscopy 12/03/2010    Fields: PUD, Bx NEGATIVE for H pylori  . Incisional hernia repair 12/13/2010    Procedure: HERNIA REPAIR INCISIONAL;  Surgeon: Dalia Heading;  Location: [facility];  Service: General;  Laterality: N/A;  with mesh  . Hernia repair   . Hydrogen breath test 01/12/2011    Positive SBBO  . Incisional hernia repair 01/24/2011    Procedure: HERNIA REPAIR INCISIONAL;  Surgeon: Dalia Heading;  Location: [facility];  Service: General;  Laterality:  N/A;  Recurrent Incisional Herniorraphy with Mesh  . Esophagogastroduodenoscopy 03/15/2011    Fields: mild gastritis, PUD resolved    Current Outpatient Prescriptions  Medication Sig Dispense Refill  . dicyclomine (BENTYL) 10 MG capsule 2 PO 30 MINUTES PRIOR TO BREAKFAST AND LUNCH  124 capsule  11  . lisinopril (PRINIVIL,ZESTRIL) 20 MG tablet Take 20 mg by mouth daily.       . metoprolol succinate (TOPROL-XL) 25 MG 24 hr tablet Take 25 mg by mouth daily.      . multivitamin (THERAGRAN) tablet Take 1 tablet by mouth daily.        Marland Kitchen omeprazole (PRILOSEC) 20 MG capsule 1 PO BID  60 capsule  5  . Probiotic Product (PROBIOTIC FORMULA PO) Take 1 capsule by mouth daily.        . Simethicone 125 MG CAPS Take 2 capsules by mouth 2 (two) times daily.        . SPRINTEC 28 0.25-35 MG-MCG tablet Take 1 tablet by mouth daily.       Marland Kitchen tetrahydrozoline-zinc (VISINE-AC) 0.05-0.25 % ophthalmic solution Place 1 drop into both eyes daily as needed. Dry Eyes       . cholestyramine (QUESTRAN) 4 GM/DOSE powder Take 0.5 packets (2 g total) by mouth daily. Not within 2 hrs of other medications  378 g  5  Allergies as of 05/22/2012  . (No Known Allergies)    Review of Systems: Gen: Denies any fever, chills, sweats, anorexia, fatigue, weakness, malaise, weight loss, and sleep disorder CV: Denies chest pain, angina, palpitations, syncope, orthopnea, PND, peripheral edema, and claudication. Resp: Denies dyspnea at rest, dyspnea with exercise, cough, sputum, wheezing, coughing up blood, and pleurisy. GI: Denies vomiting blood, jaundice, and fecal incontinence.   Derm: Denies rash, itching, dry skin, hives, moles, warts, or unhealing ulcers.  Psych: Denies depression, anxiety, memory loss, suicidal ideation, hallucinations, paranoia, and confusion. Heme: Denies bruising, bleeding, and enlarged lymph nodes.  Physical Exam: BP 128/88  Pulse 80  Temp 97.4 F (36.3 C) (Oral)  Ht 5\' 6"  (1.676 m)  Wt 250 lb  (113.399 kg)  BMI 40.35 kg/m2  LMP 05/19/2012 General:   Alert,  obese, pleasant and cooperative in NAD Eyes:  Sclera clear, no icterus.   Conjunctiva pink. Mouth:  No deformity or lesions, oropharynx pink and moist. Neck:  Supple; no masses or thyromegaly. Heart:  Regular rate and rhythm; no murmurs, clicks, rubs,  or gallops. Abdomen:  Multiple striae.  Protuberant.  Normal bowel sounds.  No bruits.  Soft, non-tender and non-distended without masses, hepatosplenomegaly or hernias noted.  No guarding or rebound tenderness.   Rectal:  Deferred. Msk:  Symmetrical without gross deformities.  Pulses:  Normal pulses noted. Extremities:  No clubbing or edema. Neurologic:  Alert and oriented x4;  grossly normal neurologically. Skin:  Intact without significant lesions or rashes.

## 2012-05-22 NOTE — Assessment & Plan Note (Signed)
Continue probiotics.  No response to antibiotics for SBBO or recent augment.  Likely secondary to IBS.

## 2012-07-18 NOTE — Progress Notes (Signed)
REVIEWED.  

## 2012-12-05 ENCOUNTER — Ambulatory Visit: Payer: BC Managed Care – PPO | Admitting: Gastroenterology

## 2012-12-20 ENCOUNTER — Ambulatory Visit (INDEPENDENT_AMBULATORY_CARE_PROVIDER_SITE_OTHER): Payer: BC Managed Care – PPO | Admitting: Gastroenterology

## 2012-12-20 ENCOUNTER — Encounter: Payer: Self-pay | Admitting: Gastroenterology

## 2012-12-20 VITALS — BP 143/91 | HR 86 | Temp 97.6°F | Ht 66.0 in | Wt 234.6 lb

## 2012-12-20 DIAGNOSIS — R197 Diarrhea, unspecified: Secondary | ICD-10-CM

## 2012-12-20 DIAGNOSIS — K279 Peptic ulcer, site unspecified, unspecified as acute or chronic, without hemorrhage or perforation: Secondary | ICD-10-CM

## 2012-12-20 DIAGNOSIS — R140 Abdominal distension (gaseous): Secondary | ICD-10-CM

## 2012-12-20 DIAGNOSIS — R141 Gas pain: Secondary | ICD-10-CM

## 2012-12-20 MED ORDER — OMEPRAZOLE 20 MG PO CPDR: DELAYED_RELEASE_CAPSULE | ORAL | Status: DC

## 2012-12-20 NOTE — Assessment & Plan Note (Signed)
DUE TO BILE SALT DIARRHEA AND/OR SIBO. SX FAIRLY WELL CONTROLLED WITH DIET AND MEDS.  LOW FAT DIET BENTYL 1-2 TIMES A DAY CIP/FLAGYL AS NEEDED OPV IN 6 MOS TO 1 YR.

## 2012-12-20 NOTE — Assessment & Plan Note (Signed)
SX RESOLVED.  PRILOSEC QD. OPV IN 6 MOS

## 2012-12-20 NOTE — Progress Notes (Signed)
CC'd to PCP 

## 2012-12-20 NOTE — Patient Instructions (Addendum)
CONTINUE BENTYL.  FOLLOW A HIGH FIBER/LOW FAT DIET VIA WEIGHT WATCHERS.  CONTINUE YOUR WEIGHT LOSS EFFORTS. CONGRATULATIONS!  USE PRILOSEC DAILY.  CALL ME FOR WORSENING BLOATING, GAS, AND/OR DIARRHEA.  FOLLOW UP IN 6 MOS TO 1 YR.

## 2012-12-20 NOTE — Progress Notes (Signed)
Subjective:    Patient ID: Alison [name], female    DOB: [birth year], 37 y.o.   MRN: [id number] Selinda Flavin, MD   HPI Diarrhea better. Now not going as often and when it comes out it's soft/sml quantities. HAS SEEN ENT IN 2014 AND BEEN ON AUGM x4 AND ONCE FOR FOOT. BMs: 1 AND MAY BE QD OR QOD. WORKS FOR [employer]. MAY NEED TO FIND A NEW JOB IN ONE YEAR. LOST 16 LBS SINCE JAN 2014 VIA WEIGHT WATCHERS.  Past Medical History  Diagnosis Date  . Bloating JAN 2012    SEP 2012 HBT C/W SIBO(1ST pk 15, 2nd pk 21)  . Ulcer 12/03/10  . Bacterial overgrowth syndrome 2012   Past Surgical History  Procedure Laterality Date  . Cesarean section  X2  . Cholecystectomy  APR 2012 [facility]    BILIARY DYSKINESIA, path: CHRONIC CHOLECYSTITIS  . Tubal ligation    . Esophagogastroduodenoscopy  12/03/2010    Lamekia Nolden: PUD, Bx NEGATIVE for H pylori  . Incisional hernia repair  12/13/2010    Procedure: HERNIA REPAIR INCISIONAL;  Surgeon: Dalia Heading;  Location: [facility];  Service: General;  Laterality: N/A;  with mesh  . Hernia repair    . Hydrogen breath test  01/12/2011    Positive SBBO  . Incisional hernia repair  01/24/2011    Procedure: HERNIA REPAIR INCISIONAL;  Surgeon: Dalia Heading;  Location: [facility];  Service: General;  Laterality: N/A;  Recurrent Incisional Herniorraphy with Mesh  . Esophagogastroduodenoscopy  03/15/2011    Cashton Hosley: mild gastritis, PUD resolved    No Known Allergies Current Outpatient Prescriptions  Medication Sig Dispense Refill  . cholestyramine (QUESTRAN) 4 GM/DOSE powder Take 0.5 packets (2 g total) by mouth daily. Not within 2 hrs of other medications NOT USING   . lisinopril (PRINIVIL,ZESTRIL) 20 MG tablet Take 20 mg by mouth daily.     . metoprolol succinate (TOPROL-XL) 25 MG 24 hr tablet Take 25 mg by mouth daily.    . multivitamin (THERAGRAN) tablet Take 1 tablet by mouth daily.      Marland Kitchen omeprazole (PRILOSEC) 20 MG capsule 1 PO BID    . Probiotic Product (PROBIOTIC  FORMULA PO) Take 1 capsule by mouth daily.      . Simethicone 125 MG CAPS Take 2 capsules by mouth 2 (two) times daily.      . SPRINTEC 28 0.25-35 MG-MCG tablet Take 1 tablet by mouth daily.     Marland Kitchen tetrahydrozoline-zinc (VISINE-AC) 0.05-0.25 % ophthalmic solution Place 1 drop into both eyes daily as needed. Dry Eyes     . dicyclomine (BENTYL) 10 MG capsule 2 PO 30 MINUTES PRIOR TO BREAKFAST AND LUNCH USU ONCE A DAY         Review of Systems     Objective:   Physical Exam  Vitals reviewed. Constitutional: She is oriented to person, place, and time. She appears well-nourished. No distress.  HENT:  Head: Normocephalic and atraumatic.  Mouth/Throat: Oropharynx is clear and moist. No oropharyngeal exudate.  Eyes: Pupils are equal, round, and reactive to light. No scleral icterus.  Neck: Normal range of motion. Neck supple.  Cardiovascular: Normal rate and normal heart sounds.   Pulmonary/Chest: Effort normal and breath sounds normal. No respiratory distress.  Abdominal: Soft. Bowel sounds are normal. She exhibits no distension.  Musculoskeletal: She exhibits no edema.  Neurological: She is alert and oriented to person, place, and time.  NO FOCAL DEFICITS   Psychiatric: She has a  normal mood and affect.          Assessment & Plan:

## 2012-12-20 NOTE — Assessment & Plan Note (Signed)
DUE TO SIBO.  ABX PRN. PT INSTRUCTED TO CALL WITH WORSENING BLOATING, GAS, OR DIARRHEA. WILL RX CIP/FLAGYL BID FOR 5 DAYS. OPV IN 6 MOS-1 YR

## 2012-12-21 NOTE — Progress Notes (Signed)
CC'd to PCP 

## 2013-05-14 ENCOUNTER — Other Ambulatory Visit: Payer: Self-pay

## 2013-05-14 MED ORDER — OMEPRAZOLE 20 MG PO CPDR: 20.0000 mg | DELAYED_RELEASE_CAPSULE | Freq: Two times a day (BID) | ORAL | Status: DC

## 2013-06-06 ENCOUNTER — Encounter: Payer: Self-pay | Admitting: Obstetrics & Gynecology

## 2013-06-06 ENCOUNTER — Encounter (INDEPENDENT_AMBULATORY_CARE_PROVIDER_SITE_OTHER): Payer: Self-pay

## 2013-06-06 ENCOUNTER — Ambulatory Visit (INDEPENDENT_AMBULATORY_CARE_PROVIDER_SITE_OTHER): Payer: BC Managed Care – PPO | Admitting: Obstetrics & Gynecology

## 2013-06-06 ENCOUNTER — Other Ambulatory Visit (HOSPITAL_COMMUNITY)
Admission: RE | Admit: 2013-06-06 | Discharge: 2013-06-06 | Disposition: A | Discharge status: Home / Self Care | Payer: BC Managed Care – PPO | Source: Ambulatory Visit | Attending: Obstetrics & Gynecology | Admitting: Obstetrics & Gynecology

## 2013-06-06 VITALS — BP 120/80 | Ht 66.0 in | Wt 195.0 lb

## 2013-06-06 DIAGNOSIS — E039 Hypothyroidism, unspecified: Secondary | ICD-10-CM

## 2013-06-06 DIAGNOSIS — Z1151 Encounter for screening for human papillomavirus (HPV): Secondary | ICD-10-CM | POA: Insufficient documentation

## 2013-06-06 DIAGNOSIS — Z01419 Encounter for gynecological examination (general) (routine) without abnormal findings: Secondary | ICD-10-CM | POA: Insufficient documentation

## 2013-06-06 MED ORDER — TERCONAZOLE 0.4 % VA CREA: 1.0000 | TOPICAL_CREAM | Freq: Every day | VAGINAL | Status: DC

## 2013-06-06 NOTE — Progress Notes (Signed)
Patient ID: Alison SchillingsJennifer V [name], female   DOB: 13-Sep-1975, 38 y.o.   MRN: [id number] Subjective:     Alison [name] is a 38 y.o. female here for a routine exam.  Patient's last menstrual period was 05/17/2013. G2P1100 Birth Control Method:  BTL Menstrual Calendar(currently): regular moderate  Current complaints: none.   Current acute medical issues:  sinusitis   Recent Gynecologic History Patient's last menstrual period was 05/17/2013. Last Pap: 2013,  normal Last mammogram: na,  na  Past Medical History  Diagnosis Date  . Bloating JAN 2012    SEP 2012 HBT C/W SIBO(1ST pk 15, 2nd pk 21)  . Ulcer 12/03/10  . Bacterial overgrowth syndrome 2012    Past Surgical History  Procedure Laterality Date  . Cesarean section  X2  . Cholecystectomy  APR 2012 [facility]    BILIARY DYSKINESIA, path: CHRONIC CHOLECYSTITIS  . Tubal ligation    . Esophagogastroduodenoscopy  12/03/2010    Fields: PUD, Bx NEGATIVE for H pylori  . Incisional hernia repair  12/13/2010    Procedure: HERNIA REPAIR INCISIONAL;  Surgeon: Dalia HeadingMark A Jenkins;  Location: [facility];  Service: General;  Laterality: N/A;  with mesh  . Hernia repair    . Hydrogen breath test  01/12/2011    Positive SBBO  . Incisional hernia repair  01/24/2011    Procedure: HERNIA REPAIR INCISIONAL;  Surgeon: Dalia HeadingMark A Jenkins;  Location: [facility];  Service: General;  Laterality: N/A;  Recurrent Incisional Herniorraphy with Mesh  . Esophagogastroduodenoscopy  03/15/2011    Fields: mild gastritis, PUD resolved    OB History   Grav Para Term Preterm Abortions TAB SAB Ect Mult Living   2 2 1 1             History   Social History  . Marital Status: Married    Spouse Name: N/A    Number of Children: N/A  . Years of Education: N/A   Social History Main Topics  . Smoking status: Current Every Day Smoker -- 0.50 packs/day for 10 years    Types: Cigarettes  . Smokeless tobacco: Never Used     Comment: 10 cigarettes daily  . Alcohol Use: No  . Drug Use: No   . Sexual Activity: Yes    Birth Control/ Protection: None, Surgical   Other Topics Concern  . None   Social History Narrative  . None    Family History  Problem Relation Age of Onset  . Anesthesia problems Neg Hx   . Hypotension Neg Hx   . Malignant hyperthermia Neg Hx   . Pseudochol deficiency Neg Hx   . Colon cancer Neg Hx   . Diabetes Maternal Grandfather   . Thalassemia Father      Review of Systems  Review of Systems  Constitutional: Negative for fever, chills, weight loss, malaise/fatigue and diaphoresis.  HENT: Negative for hearing loss, ear pain, nosebleeds, congestion, sore throat, neck pain, tinnitus and ear discharge.   Eyes: Negative for blurred vision, double vision, photophobia, pain, discharge and redness.  Respiratory: Negative for cough, hemoptysis, sputum production, shortness of breath, wheezing and stridor.   Cardiovascular: Negative for chest pain, palpitations, orthopnea, claudication, leg swelling and PND.  Gastrointestinal: negative for abdominal pain. Negative for heartburn, nausea, vomiting, diarrhea, constipation, blood in stool and melena.  Genitourinary: Negative for dysuria, urgency, frequency, hematuria and flank pain.  Musculoskeletal: Negative for myalgias, back pain, joint pain and falls.  Skin: Negative for itching and rash.  Neurological: Negative for  dizziness, tingling, tremors, sensory change, speech change, focal weakness, seizures, loss of consciousness, weakness and headaches.  Endo/Heme/Allergies: Negative for environmental allergies and polydipsia. Does not bruise/bleed easily.  Psychiatric/Behavioral: Negative for depression, suicidal ideas, hallucinations, memory loss and substance abuse. The patient is not nervous/anxious and does not have insomnia.        Objective:    Physical Exam  Vitals reviewed. Constitutional: She is oriented to person, place, and time. She appears well-developed and well-nourished.  HENT:  Head:  Normocephalic and atraumatic.        Right Ear: External ear normal.  Left Ear: External ear normal.  Nose: Nose normal.  Mouth/Throat: Oropharynx is clear and moist.  Eyes: Conjunctivae and EOM are normal. Pupils are equal, round, and reactive to light. Right eye exhibits no discharge. Left eye exhibits no discharge. No scleral icterus.  Neck: Normal range of motion. Neck supple. No tracheal deviation present. No thyromegaly present.  Cardiovascular: Normal rate, regular rhythm, normal heart sounds and intact distal pulses.  Exam reveals no gallop and no friction rub.   No murmur heard. Respiratory: Effort normal and breath sounds normal. No respiratory distress. She has no wheezes. She has no rales. She exhibits no tenderness.  GI: Soft. Bowel sounds are normal. She exhibits no distension and no mass. There is no tenderness. There is no rebound and no guarding.  Genitourinary:  Breasts no masses skin changes or nipple changes bilaterally      Vulva is normal without lesions Vagina is pink moist without discharge Cervix normal in appearance and pap is done Uterus is normal size shape and contour Adnexa is negative with normal sized ovaries    Musculoskeletal: Normal range of motion. She exhibits no edema and no tenderness.  Neurological: She is alert and oriented to person, place, and time. She has normal reflexes. She displays normal reflexes. No cranial nerve deficit. She exhibits normal muscle tone. Coordination normal.  Skin: Skin is warm and dry. No rash noted. No erythema. No pallor.  Psychiatric: She has a normal mood and affect. Her behavior is normal. Judgment and thought content normal.       Assessment:    Healthy female exam.    Plan:    Contraception: tubal ligation and Nexplanon. Follow up in: 1 year.    Yeast vaginitis

## 2013-06-07 LAB — TSH: TSH: 0.828 u[IU]/mL (ref 0.350–4.500)

## 2013-06-20 ENCOUNTER — Telehealth: Payer: Self-pay | Admitting: Obstetrics and Gynecology

## 2013-06-20 NOTE — Telephone Encounter (Signed)
Pt informed of WNL TSH, Pap WNL, only yeast. Pt states Dr. Despina HiddenEure has treated the yeast and she finished abx and "feeling better."

## 2014-01-16 ENCOUNTER — Telehealth: Payer: Self-pay | Admitting: *Deleted

## 2014-01-16 MED ORDER — KETOROLAC TROMETHAMINE 10 MG PO TABS: 10.0000 mg | ORAL_TABLET | Freq: Three times a day (TID) | ORAL | Status: DC | PRN

## 2014-01-16 NOTE — Telephone Encounter (Signed)
Pt c/o pain bilateral sides (rated at 6 on 1-10 scale) and bloating with ovulation. Motrin helped with pain but not with bloating. Is this normal? If so is there something pt can take to help with symptoms.

## 2014-01-17 ENCOUNTER — Telehealth: Payer: Self-pay | Admitting: *Deleted

## 2014-01-17 NOTE — Telephone Encounter (Signed)
Pt informed toradol escribed.

## 2014-03-10 ENCOUNTER — Encounter: Payer: Self-pay | Admitting: Obstetrics & Gynecology

## 2014-03-17 ENCOUNTER — Telehealth: Payer: Self-pay | Admitting: Gastroenterology

## 2014-03-17 MED ORDER — CIPROFLOXACIN HCL 500 MG PO TABS: 500.0000 mg | ORAL_TABLET | Freq: Two times a day (BID) | ORAL | Status: DC

## 2014-03-17 MED ORDER — METRONIDAZOLE 500 MG PO TABS: 500.0000 mg | ORAL_TABLET | Freq: Two times a day (BID) | ORAL | Status: DC

## 2014-03-17 NOTE — Telephone Encounter (Addendum)
I'm okay with a trial of Cipro and Flagyl BID for 5 days as per [facility] recommendations. This was for small bowel bacterial overgrowth (which can be recurrent issues), NOT h.pylori.   She can keep OV in 04/2014 in case she has ongoing problems.   RX done.

## 2014-03-17 NOTE — Telephone Encounter (Signed)
I spoke to pt. She was requesting antibiotics like she had for H Pylori previously. She said that Dr. Darrick PennaFields just told her to call if she has a problem and she would give her the antibiotics. I told her it has been over a year since we seen her and we cannot do that. She said she has diarrhea to constipation and a lot of bloating. ' I offered her an appt 04/22/2014 and she said she will be gone all that week. She then said that she couldn't come in til after Christmas and is scheduled an OV with Tana CoastLeslie Lewis, PA on 05/07/2014 at 8:00 AM.   Routing to BranchLeslie to see if she has recommendations prior to that appt.

## 2014-03-17 NOTE — Telephone Encounter (Signed)
Patient is feeling bloated again, dr. Darrick PennaFields told her to call here and she could have her antibiotics called in for her,  Please advise  [phone]    Pharmacy is [facility] in [place] .

## 2014-03-17 NOTE — Addendum Note (Signed)
Addended by: Tiffany KocherLEWIS, Wane Mollett S on: 03/17/2014 12:39 PM   Modules accepted: Orders

## 2014-03-18 NOTE — Telephone Encounter (Signed)
LMOM to call.

## 2014-03-18 NOTE — Telephone Encounter (Signed)
PT returned call and is aware.

## 2014-04-28 ENCOUNTER — Other Ambulatory Visit: Payer: Self-pay

## 2014-04-28 NOTE — Telephone Encounter (Deleted)
Opened in error

## 2014-04-28 NOTE — Telephone Encounter (Deleted)
Script was not opened error. Still needs to be filled

## 2014-04-29 MED ORDER — OMEPRAZOLE 20 MG PO CPDR: 20.0000 mg | DELAYED_RELEASE_CAPSULE | Freq: Two times a day (BID) | ORAL | Status: DC

## 2014-05-07 ENCOUNTER — Encounter: Payer: Self-pay | Admitting: Gastroenterology

## 2014-05-07 ENCOUNTER — Ambulatory Visit (INDEPENDENT_AMBULATORY_CARE_PROVIDER_SITE_OTHER): Payer: BC Managed Care – PPO | Admitting: Gastroenterology

## 2014-05-07 VITALS — BP 147/93 | HR 73 | Temp 98.1°F | Ht 66.0 in | Wt 191.2 lb

## 2014-05-07 DIAGNOSIS — K5900 Constipation, unspecified: Secondary | ICD-10-CM | POA: Insufficient documentation

## 2014-05-07 DIAGNOSIS — R140 Abdominal distension (gaseous): Secondary | ICD-10-CM

## 2014-05-07 MED ORDER — LINACLOTIDE 145 MCG PO CAPS: 145.0000 ug | ORAL_CAPSULE | Freq: Every day | ORAL | Status: DC

## 2014-05-07 NOTE — Assessment & Plan Note (Signed)
Change in bowel habits from previous diarrhea predominance, now taking laxative daily. Symptoms associated with bloating if she does not have an adequate bowel movement. Recent empirical trial of antibiotics for SBBO did not help. She takes probiotics chronically, alters brand monthly. 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 Did offer patient colonoscopy for change in bowel habits but she would like to try medication first. Linzess 145 g daily before breakfast. If no improvement in bloating and bowel function within the next 6-8 weeks, she will let us know and we will consider colonoscopy at that time. We'll also check I FOBT as well.

## 2014-05-07 NOTE — Progress Notes (Signed)
cc'ed to pcp °

## 2014-05-07 NOTE — Progress Notes (Signed)
Primary Care Physician: Selinda FlavinHOWARD, KEVIN, MD  Primary Gastroenterologist:  Jonette EvaSandi Fields, MD   Chief Complaint  Patient presents with  . Constipation    HPI: Cay SchillingsJennifer V Luten is a 38 y.o. female here for follow-up of constipation alternating diarrhea and abdominal bloating. She called in 03/2014 requesting empirical antibiotics for small bowel bacterial overgrowth. Previously positive hydrogen breath test. Last seen in August 2014. Also with questionable history of underlying IBS. Weight is down to 191 pounds from 234 pounds, intentional. Last EGD in November 2012 she had mild gastritis, peptic ulcer disease resolved. No prior colonoscopy.Down over 70+ pounds in 18 months or so.  Having to use laxatives to go to the bathroom, abdominal bloating. Women's laxative, one at night every night. Than at least one bowel movement. Eating a lot of protein and fiber. Much healthier lifestyle. Used to have diarrhea predominantly, postprandially. No rectal bleeding. No abdominal pain. Cipro/Flagyl did not help. No N/V. No epigastric burning. LMP regular. Pain and bloating with ovulation a few cycles ago. Discussed with her OB/GYN.  Current Outpatient Prescriptions  Medication Sig Dispense Refill  . multivitamin (THERAGRAN) tablet Take 1 tablet by mouth daily.      Marland Kitchen. omeprazole (PRILOSEC) 20 MG capsule Take 1 capsule (20 mg total) by mouth 2 (two) times daily before a meal. 1 PO BID 60 capsule 5  . Probiotic Product (PROBIOTIC FORMULA PO) Take 1 capsule by mouth daily.      Marland Kitchen. dicyclomine (BENTYL) 10 MG capsule 2 PO 30 MINUTES PRIOR TO BREAKFAST AND LUNCH 124 capsule 11   No current facility-administered medications for this visit.    Allergies as of 05/07/2014  . (No Known Allergies)   Past Medical History  Diagnosis Date  . Bloating JAN 2012    SEP 2012 HBT C/W SIBO(1ST pk 15, 2nd pk 21)  . Ulcer 12/03/10  . Bacterial overgrowth syndrome 2012   Past Surgical History  Procedure Laterality  Date  . Cesarean section  X2  . Cholecystectomy  APR 2012 [facility]    BILIARY DYSKINESIA, path: CHRONIC CHOLECYSTITIS  . Tubal ligation    . Esophagogastroduodenoscopy  12/03/2010    Fields: PUD, Bx NEGATIVE for H pylori  . Incisional hernia repair  12/13/2010    Procedure: HERNIA REPAIR INCISIONAL;  Surgeon: Dalia HeadingMark A Jenkins;  Location: [facility];  Service: General;  Laterality: N/A;  with mesh  . Hernia repair    . Hydrogen breath test  01/12/2011    Positive SBBO  . Incisional hernia repair  01/24/2011    Procedure: HERNIA REPAIR INCISIONAL;  Surgeon: Dalia HeadingMark A Jenkins;  Location: [facility];  Service: General;  Laterality: N/A;  Recurrent Incisional Herniorraphy with Mesh  . Esophagogastroduodenoscopy  03/15/2011    Fields: mild gastritis, PUD resolved    ROS:  General: Negative for anorexia, weight loss, fever, chills, fatigue, weakness. ENT: Negative for hoarseness, difficulty swallowing , nasal congestion. CV: Negative for chest pain, angina, palpitations, dyspnea on exertion, peripheral edema.  Respiratory: Negative for dyspnea at rest, dyspnea on exertion, cough, sputum, wheezing.  GI: See history of present illness. GU:  Negative for dysuria, hematuria, urinary incontinence, urinary frequency, nocturnal urination.  Endo: Negative for unusual weight change.    Physical Examination:   BP 147/93 mmHg  Pulse 73  Temp(Src) 98.1 F (36.7 C) (Oral)  Ht 5\' 6"  (1.676 m)  Wt 191 lb 3.2 oz (86.728 kg)  BMI 30.88 kg/m2  LMP 05/05/2014  General: Well-nourished, well-developed in  no acute distress.  Eyes: No icterus. Mouth: Oropharyngeal mucosa moist and pink , no lesions erythema or exudate. Lungs: Clear to auscultation bilaterally.  Heart: Regular rate and rhythm, no murmurs rubs or gallops.  Abdomen: Bowel sounds are normal, nontender, nondistended, no hepatosplenomegaly or masses, no abdominal bruits or hernia , no rebound or guarding.   Extremities: No lower extremity edema. No clubbing or  deformities. Neuro: Alert and oriented x 4   Skin: Warm and dry, no jaundice.   Psych: Alert and cooperative, normal mood and affect.

## 2014-05-07 NOTE — Patient Instructions (Signed)
1. Hold Bentyl for now, this will contribute to constipation. 2. Start Linzess one daily before breakfast for constipation/bloating related to IBS. Prescription sent to your pharmacy. Please call if you do not have improvement in your bloating and bowel function. At that time I would consider a colonoscopy. 3. Complete stool specimen to look for blood in your stool.

## 2014-09-08 ENCOUNTER — Ambulatory Visit (INDEPENDENT_AMBULATORY_CARE_PROVIDER_SITE_OTHER): Payer: BLUE CROSS/BLUE SHIELD | Admitting: Obstetrics & Gynecology

## 2014-09-08 ENCOUNTER — Encounter: Payer: Self-pay | Admitting: Obstetrics & Gynecology

## 2014-09-08 ENCOUNTER — Other Ambulatory Visit (HOSPITAL_COMMUNITY)
Admission: RE | Admit: 2014-09-08 | Discharge: 2014-09-08 | Disposition: A | Discharge status: Home / Self Care | Payer: Self-pay | Source: Ambulatory Visit | Attending: Obstetrics & Gynecology | Admitting: Obstetrics & Gynecology

## 2014-09-08 VITALS — BP 126/80 | HR 68 | Ht 65.5 in | Wt 188.0 lb

## 2014-09-08 DIAGNOSIS — Z01419 Encounter for gynecological examination (general) (routine) without abnormal findings: Secondary | ICD-10-CM

## 2014-09-08 NOTE — Progress Notes (Signed)
Patient ID: Alison [name], female   DOB: 14-Aug-1975, 39 y.o.   MRN: [id number] Subjective:     Alison [name] is a 39 y.o. female here for a routine exam.  Patient's last menstrual period was 08/17/2014. G2P1100 Birth Control Method:  BTL Menstrual Calendar(currently): regular  Current complaints: none.   Current acute medical issues:  constipation   Recent Gynecologic History Patient's last menstrual period was 08/17/2014. Last Pap: 2015,  normal Last mammogram: ,    Past Medical History  Diagnosis Date  . Bloating JAN 2012    SEP 2012 HBT C/W SIBO(1ST pk 15, 2nd pk 21)  . Ulcer 12/03/10  . Bacterial overgrowth syndrome 2012    Past Surgical History  Procedure Laterality Date  . Cesarean section  X2  . Cholecystectomy  APR 2012 [facility]    BILIARY DYSKINESIA, path: CHRONIC CHOLECYSTITIS  . Tubal ligation    . Esophagogastroduodenoscopy  12/03/2010    Fields: PUD, Bx NEGATIVE for H pylori  . Incisional hernia repair  12/13/2010    Procedure: HERNIA REPAIR INCISIONAL;  Surgeon: Dalia HeadingMark A Jenkins;  Location: [facility];  Service: General;  Laterality: N/A;  with mesh  . Hernia repair    . Hydrogen breath test  01/12/2011    Positive SBBO  . Incisional hernia repair  01/24/2011    Procedure: HERNIA REPAIR INCISIONAL;  Surgeon: Dalia HeadingMark A Jenkins;  Location: [facility];  Service: General;  Laterality: N/A;  Recurrent Incisional Herniorraphy with Mesh  . Esophagogastroduodenoscopy  03/15/2011    Fields: mild gastritis, PUD resolved    OB History    Gravida Para Term Preterm AB TAB SAB Ectopic Multiple Living   2 2 1 1             History   Social History  . Marital Status: Married    Spouse Name: N/A  . Number of Children: N/A  . Years of Education: N/A   Social History Main Topics  . Smoking status: Current Every Day Smoker -- 0.50 packs/day for 10 years    Types: Cigarettes  . Smokeless tobacco: Never Used     Comment: 10 cigarettes daily  . Alcohol Use: No  . Drug Use: No  .  Sexual Activity: Yes    Birth Control/ Protection: None, Surgical   Other Topics Concern  . None   Social History Narrative    Family History  Problem Relation Age of Onset  . Anesthesia problems Neg Hx   . Hypotension Neg Hx   . Malignant hyperthermia Neg Hx   . Pseudochol deficiency Neg Hx   . Colon cancer Neg Hx   . Diabetes Maternal Grandfather   . Thalassemia Father      Current outpatient prescriptions:  .  fexofenadine (ALLEGRA) 180 MG tablet, Take 180 mg by mouth daily., Disp: , Rfl:  .  multivitamin (THERAGRAN) tablet, Take 1 tablet by mouth daily.  , Disp: , Rfl:  .  omeprazole (PRILOSEC) 20 MG capsule, Take 1 capsule (20 mg total) by mouth 2 (two) times daily before a meal. 1 PO BID, Disp: 60 capsule, Rfl: 5 .  Probiotic Product (PROBIOTIC FORMULA PO), Take 1 capsule by mouth daily.  , Disp: , Rfl:  .  dicyclomine (BENTYL) 10 MG capsule, 2 PO 30 MINUTES PRIOR TO BREAKFAST AND LUNCH only when having diarrhea/cramps, Disp: 124 capsule, Rfl: 11 .  Linaclotide (LINZESS) 145 MCG CAPS capsule, Take 1 capsule (145 mcg total) by mouth daily. (Patient not taking: Reported  on 09/08/2014), Disp: 30 capsule, Rfl: 11  Review of Systems  Review of Systems  Constitutional: Negative for fever, chills, weight loss, malaise/fatigue and diaphoresis.  HENT: Negative for hearing loss, ear pain, nosebleeds, congestion, sore throat, neck pain, tinnitus and ear discharge.   Eyes: Negative for blurred vision, double vision, photophobia, pain, discharge and redness.  Respiratory: Negative for cough, hemoptysis, sputum production, shortness of breath, wheezing and stridor.   Cardiovascular: Negative for chest pain, palpitations, orthopnea, claudication, leg swelling and PND.  Gastrointestinal: negative for abdominal pain. Negative for heartburn, nausea, vomiting, diarrhea, constipation, blood in stool and melena.  Genitourinary: Negative for dysuria, urgency, frequency, hematuria and flank pain.   Musculoskeletal: Negative for myalgias, back pain, joint pain and falls.  Skin: Negative for itching and rash.  Neurological: Negative for dizziness, tingling, tremors, sensory change, speech change, focal weakness, seizures, loss of consciousness, weakness and headaches.  Endo/Heme/Allergies: Negative for environmental allergies and polydipsia. Does not bruise/bleed easily.  Psychiatric/Behavioral: Negative for depression, suicidal ideas, hallucinations, memory loss and substance abuse. The patient is not nervous/anxious and does not have insomnia.        Objective:  Blood pressure 126/80, pulse 68, height 5' 5.5" (1.664 m), weight 188 lb (85.276 kg), last menstrual period 08/17/2014.   Physical Exam  Vitals reviewed. Constitutional: She is oriented to person, place, and time. She appears well-developed and well-nourished.  HENT:  Head: Normocephalic and atraumatic.        Right Ear: External ear normal.  Left Ear: External ear normal.  Nose: Nose normal.  Mouth/Throat: Oropharynx is clear and moist.  Eyes: Conjunctivae and EOM are normal. Pupils are equal, round, and reactive to light. Right eye exhibits no discharge. Left eye exhibits no discharge. No scleral icterus.  Neck: Normal range of motion. Neck supple. No tracheal deviation present. No thyromegaly present.  Cardiovascular: Normal rate, regular rhythm, normal heart sounds and intact distal pulses.  Exam reveals no gallop and no friction rub.   No murmur heard. Respiratory: Effort normal and breath sounds normal. No respiratory distress. She has no wheezes. She has no rales. She exhibits no tenderness.  GI: Soft. Bowel sounds are normal. She exhibits no distension and no mass. There is no tenderness. There is no rebound and no guarding.  Genitourinary:  Breasts no masses skin changes or nipple changes bilaterally      Vulva is normal without lesions Vagina is pink moist without discharge Cervix normal in appearance and pap is  done Uterus is normal size shape and contour Adnexa is negative with normal sized ovaries   Musculoskeletal: Normal range of motion. She exhibits no edema and no tenderness.  Neurological: She is alert and oriented to person, place, and time. She has normal reflexes. She displays normal reflexes. No cranial nerve deficit. She exhibits normal muscle tone. Coordination normal.  Skin: Skin is warm and dry. No rash noted. No erythema. No pallor.  Psychiatric: She has a normal mood and affect. Her behavior is normal. Judgment and thought content normal.       Assessment:    Healthy female exam.    Plan:    Contraception: tubal ligation. Follow up in: 1 year.

## 2014-09-09 LAB — CYTOLOGY - PAP

## 2015-06-09 ENCOUNTER — Other Ambulatory Visit: Payer: Self-pay

## 2015-06-09 DIAGNOSIS — Z1239 Encounter for other screening for malignant neoplasm of breast: Secondary | ICD-10-CM

## 2015-06-23 ENCOUNTER — Ambulatory Visit
Admission: RE | Admit: 2015-06-23 | Discharge: 2015-06-23 | Disposition: A | Discharge status: Home / Self Care | Payer: BLUE CROSS/BLUE SHIELD | Source: Ambulatory Visit

## 2015-06-23 DIAGNOSIS — Z1239 Encounter for other screening for malignant neoplasm of breast: Secondary | ICD-10-CM

## 2016-08-08 ENCOUNTER — Encounter: Payer: Self-pay | Admitting: Obstetrics & Gynecology

## 2016-08-08 ENCOUNTER — Ambulatory Visit (INDEPENDENT_AMBULATORY_CARE_PROVIDER_SITE_OTHER): Payer: Managed Care, Other (non HMO) | Admitting: Obstetrics & Gynecology

## 2016-08-08 ENCOUNTER — Other Ambulatory Visit (HOSPITAL_COMMUNITY)
Admission: RE | Admit: 2016-08-08 | Discharge: 2016-08-08 | Disposition: A | Discharge status: Home / Self Care | Payer: Managed Care, Other (non HMO) | Source: Ambulatory Visit | Attending: Obstetrics & Gynecology | Admitting: Obstetrics & Gynecology

## 2016-08-08 VITALS — BP 126/82 | HR 68 | Ht 66.5 in | Wt 219.0 lb

## 2016-08-08 DIAGNOSIS — Z113 Encounter for screening for infections with a predominantly sexual mode of transmission: Secondary | ICD-10-CM | POA: Insufficient documentation

## 2016-08-08 DIAGNOSIS — Z01419 Encounter for gynecological examination (general) (routine) without abnormal findings: Secondary | ICD-10-CM | POA: Insufficient documentation

## 2016-08-08 DIAGNOSIS — Z1151 Encounter for screening for human papillomavirus (HPV): Secondary | ICD-10-CM | POA: Diagnosis present

## 2016-08-08 NOTE — Progress Notes (Signed)
Subjective:     Alison [name] is a 41 y.o. female here for a routine exam.  Patient's last menstrual period was 07/18/2016 (exact date). G2P1100 Birth Control Method:  Tubal ligation Menstrual Calendar(currently): regualr  Current complaints: none.   Current acute medical issues:  none   Recent Gynecologic History Patient's last menstrual period was 07/18/2016 (exact date). Last Pap: 2017,  normal Last mammogram: 2017,  normal  Past Medical History:  Diagnosis Date  . Bacterial overgrowth syndrome 2012  . Bloating JAN 2012   SEP 2012 HBT C/W SIBO(1ST pk 15, 2nd pk 21)  . Ulcer (HCC) 12/03/10    Past Surgical History:  Procedure Laterality Date  . CESAREAN SECTION  X2  . CHOLECYSTECTOMY  APR 2012 [facility]   BILIARY DYSKINESIA, path: CHRONIC CHOLECYSTITIS  . ESOPHAGOGASTRODUODENOSCOPY  12/03/2010   Fields: PUD, Bx NEGATIVE for H pylori  . ESOPHAGOGASTRODUODENOSCOPY  03/15/2011   Fields: mild gastritis, PUD resolved  . HERNIA REPAIR    . HYDROGEN BREATH TEST  01/12/2011   Positive SBBO  . INCISIONAL HERNIA REPAIR  12/13/2010   Procedure: HERNIA REPAIR INCISIONAL;  Surgeon: Dalia Heading;  Location: [facility];  Service: General;  Laterality: N/A;  with mesh  . INCISIONAL HERNIA REPAIR  01/24/2011   Procedure: HERNIA REPAIR INCISIONAL;  Surgeon: Dalia Heading;  Location: [facility];  Service: General;  Laterality: N/A;  Recurrent Incisional Herniorraphy with Mesh  . TUBAL LIGATION      OB History    Gravida Para Term Preterm AB Living   SAB TAB Ectopic Multiple Live Births                  Social History   Social History  . Marital status: Married    Spouse name: N/A  . Number of children: N/A  . Years of education: N/A   Social History Main Topics  . Smoking status: Current Every Day Smoker    Packs/day: 0.50    Years: 10.00    Types: Cigarettes  . Smokeless tobacco: Never Used     Comment: 10 cigarettes daily  . Alcohol use No  . Drug use: No  .  Sexual activity: Yes    Birth control/ protection: None, Surgical   Other Topics Concern  . None   Social History Narrative  . None    Family History  Problem Relation Age of Onset  . Diabetes Maternal Grandfather   . Thalassemia Father   . Anesthesia problems Neg Hx   . Hypotension Neg Hx   . Malignant hyperthermia Neg Hx   . Pseudochol deficiency Neg Hx   . Colon cancer Neg Hx      Current Outpatient Prescriptions:  .  fexofenadine (ALLEGRA) 180 MG tablet, Take 180 mg by mouth daily., Disp: , Rfl:  .  multivitamin (THERAGRAN) tablet, Take 1 tablet by mouth daily.  , Disp: , Rfl:  .  omeprazole (PRILOSEC) 20 MG capsule, Take 1 capsule (20 mg total) by mouth 2 (two) times daily before a meal. 1 PO BID, Disp: 60 capsule, Rfl: 5 .  Probiotic Product (PROBIOTIC FORMULA PO), Take 1 capsule by mouth daily.  , Disp: , Rfl:   Review of Systems  Review of Systems  Constitutional: Negative for fever, chills, weight loss, malaise/fatigue and diaphoresis.  HENT: Negative for hearing loss, ear pain, nosebleeds, congestion, sore throat, neck pain, tinnitus and ear discharge.   Eyes: Negative  for blurred vision, double vision, photophobia, pain, discharge and redness.  Respiratory: Negative for cough, hemoptysis, sputum production, shortness of breath, wheezing and stridor.   Cardiovascular: Negative for chest pain, palpitations, orthopnea, claudication, leg swelling and PND.  Gastrointestinal: negative for abdominal pain. Negative for heartburn, nausea, vomiting, diarrhea, constipation, blood in stool and melena.  Genitourinary: Negative for dysuria, urgency, frequency, hematuria and flank pain.  Musculoskeletal: Negative for myalgias, back pain, joint pain and falls.  Skin: Negative for itching and rash.  Neurological: Negative for dizziness, tingling, tremors, sensory change, speech change, focal weakness, seizures, loss of consciousness, weakness and headaches.  Endo/Heme/Allergies:  Negative for environmental allergies and polydipsia. Does not bruise/bleed easily.  Psychiatric/Behavioral: Negative for depression, suicidal ideas, hallucinations, memory loss and substance abuse. The patient is not nervous/anxious and does not have insomnia.        Objective:  Blood pressure 126/82, pulse 68, height 5' 6.5" (1.689 m), weight 219 lb (99.3 kg), last menstrual period 07/18/2016.   Physical Exam  Vitals reviewed. Constitutional: She is oriented to person, place, and time. She appears well-developed and well-nourished.  HENT:  Head: Normocephalic and atraumatic.        Right Ear: External ear normal.  Left Ear: External ear normal.  Nose: Nose normal.  Mouth/Throat: Oropharynx is clear and moist.  Eyes: Conjunctivae and EOM are normal. Pupils are equal, round, and reactive to light. Right eye exhibits no discharge. Left eye exhibits no discharge. No scleral icterus.  Neck: Normal range of motion. Neck supple. No tracheal deviation present. No thyromegaly present.  Cardiovascular: Normal rate, regular rhythm, normal heart sounds and intact distal pulses.  Exam reveals no gallop and no friction rub.   No murmur heard. Respiratory: Effort normal and breath sounds normal. No respiratory distress. She has no wheezes. She has no rales. She exhibits no tenderness.  GI: Soft. Bowel sounds are normal. She exhibits no distension and no mass. There is no tenderness. There is no rebound and no guarding.  Genitourinary:  Breasts no masses skin changes or nipple changes bilaterally      Vulva is normal without lesions Vagina is pink moist without discharge Cervix normal in appearance and pap is done Uterus is normal size shape and contour Adnexa is negative with normal sized ovaries   Musculoskeletal: Normal range of motion. She exhibits no edema and no tenderness.  Neurological: She is alert and oriented to person, place, and time. She has normal reflexes. She displays normal  reflexes. No cranial nerve deficit. She exhibits normal muscle tone. Coordination normal.  Skin: Skin is warm and dry. No rash noted. No erythema. No pallor.  Psychiatric: She has a normal mood and affect. Her behavior is normal. Judgment and thought content normal.       Medications Ordered at today's visit: No orders of the defined types were placed in this encounter.   Other orders placed at today's visit: No orders of the defined types were placed in this encounter.     Assessment:    Healthy female exam.    Plan:    Contraception: tubal ligation. Mammogram ordered. Follow up in: 1 year.     Return in about 2 years (around 08/09/2018) for yearly, with Dr Despina Hidden.

## 2016-08-12 LAB — CYTOLOGY - PAP
Chlamydia: NEGATIVE
DIAGNOSIS: NEGATIVE
HPV (WINDOPATH): NOT DETECTED
Neisseria Gonorrhea: NEGATIVE

## 2016-09-09 ENCOUNTER — Telehealth: Payer: Self-pay | Admitting: *Deleted

## 2016-09-09 NOTE — Telephone Encounter (Signed)
Informed patient of normal pap. Also encouraged mychart signup so she could see results. Code sent for sign up

## 2017-07-06 NOTE — Progress Notes (Signed)
REVIEWED-NO ADDITIONAL RECOMMENDATIONS. 

## 2018-01-22 ENCOUNTER — Other Ambulatory Visit: Payer: Managed Care, Other (non HMO) | Admitting: Obstetrics & Gynecology

## 2018-07-02 ENCOUNTER — Ambulatory Visit (INDEPENDENT_AMBULATORY_CARE_PROVIDER_SITE_OTHER): Payer: PRIVATE HEALTH INSURANCE | Admitting: Adult Health

## 2018-07-02 ENCOUNTER — Encounter: Payer: Self-pay | Admitting: Adult Health

## 2018-07-02 ENCOUNTER — Encounter (INDEPENDENT_AMBULATORY_CARE_PROVIDER_SITE_OTHER): Payer: Self-pay

## 2018-07-02 VITALS — BP 146/93 | HR 83 | Ht 65.25 in | Wt 245.5 lb

## 2018-07-02 DIAGNOSIS — R109 Unspecified abdominal pain: Secondary | ICD-10-CM

## 2018-07-02 DIAGNOSIS — N920 Excessive and frequent menstruation with regular cycle: Secondary | ICD-10-CM

## 2018-07-02 DIAGNOSIS — Z1211 Encounter for screening for malignant neoplasm of colon: Secondary | ICD-10-CM

## 2018-07-02 DIAGNOSIS — Z1212 Encounter for screening for malignant neoplasm of rectum: Secondary | ICD-10-CM

## 2018-07-02 DIAGNOSIS — Z01419 Encounter for gynecological examination (general) (routine) without abnormal findings: Secondary | ICD-10-CM

## 2018-07-02 DIAGNOSIS — N816 Rectocele: Secondary | ICD-10-CM

## 2018-07-02 DIAGNOSIS — N921 Excessive and frequent menstruation with irregular cycle: Secondary | ICD-10-CM

## 2018-07-02 DIAGNOSIS — R140 Abdominal distension (gaseous): Secondary | ICD-10-CM

## 2018-07-02 DIAGNOSIS — R030 Elevated blood-pressure reading, without diagnosis of hypertension: Secondary | ICD-10-CM

## 2018-07-02 LAB — HEMOCCULT GUIAC POC 1CARD (OFFICE): FECAL OCCULT BLD: NEGATIVE

## 2018-07-02 NOTE — Patient Instructions (Signed)
DASH Eating Plan  DASH stands for "Dietary Approaches to Stop Hypertension." The DASH eating plan is a healthy eating plan that has been shown to reduce high blood pressure (hypertension). It may also reduce your risk for type 2 diabetes, heart disease, and stroke. The DASH eating plan may also help with weight loss.  What are tips for following this plan?    General guidelines   Avoid eating more than 2,300 mg (milligrams) of salt (sodium) a day. If you have hypertension, you may need to reduce your sodium intake to 1,500 mg a day.   Limit alcohol intake to no more than 1 drink a day for nonpregnant women and 2 drinks a day for men. One drink equals 12 oz of beer, 5 oz of wine, or 1 oz of hard liquor.   Work with your health care provider to maintain a healthy body weight or to lose weight. Ask what an ideal weight is for you.   Get at least 30 minutes of exercise that causes your heart to beat faster (aerobic exercise) most days of the week. Activities may include walking, swimming, or biking.   Work with your health care provider or diet and nutrition specialist (dietitian) to adjust your eating plan to your individual calorie needs.  Reading food labels     Check food labels for the amount of sodium per serving. Choose foods with less than 5 percent of the Daily Value of sodium. Generally, foods with less than 300 mg of sodium per serving fit into this eating plan.   To find whole grains, look for the word "whole" as the first word in the ingredient list.  Shopping   Buy products labeled as "low-sodium" or "no salt added."   Buy fresh foods. Avoid canned foods and premade or frozen meals.  Cooking   Avoid adding salt when cooking. Use salt-free seasonings or herbs instead of table salt or sea salt. Check with your health care provider or pharmacist before using salt substitutes.   Do not fry foods. Cook foods using healthy methods such as baking, boiling, grilling, and broiling instead.   Cook with  heart-healthy oils, such as olive, canola, soybean, or sunflower oil.  Meal planning   Eat a balanced diet that includes:  ? 5 or more servings of fruits and vegetables each day. At each meal, try to fill half of your plate with fruits and vegetables.  ? Up to 6-8 servings of whole grains each day.  ? Less than 6 oz of lean meat, poultry, or fish each day. A 3-oz serving of meat is about the same size as a deck of cards. One egg equals 1 oz.  ? 2 servings of low-fat dairy each day.  ? A serving of nuts, seeds, or beans 5 times each week.  ? Heart-healthy fats. Healthy fats called Omega-3 fatty acids are found in foods such as flaxseeds and coldwater fish, like sardines, salmon, and mackerel.   Limit how much you eat of the following:  ? Canned or prepackaged foods.  ? Food that is high in trans fat, such as fried foods.  ? Food that is high in saturated fat, such as fatty meat.  ? Sweets, desserts, sugary drinks, and other foods with added sugar.  ? Full-fat dairy products.   Do not salt foods before eating.   Try to eat at least 2 vegetarian meals each week.   Eat more home-cooked food and less restaurant, buffet, and fast food.     When eating at a restaurant, ask that your food be prepared with less salt or no salt, if possible.  What foods are recommended?  The items listed may not be a complete list. Talk with your dietitian about what dietary choices are best for you.  Grains  Whole-grain or whole-wheat bread. Whole-grain or whole-wheat pasta. Brown rice. Oatmeal. Quinoa. Bulgur. Whole-grain and low-sodium cereals. Pita bread. Low-fat, low-sodium crackers. Whole-wheat flour tortillas.  Vegetables  Fresh or frozen vegetables (raw, steamed, roasted, or grilled). Low-sodium or reduced-sodium tomato and vegetable juice. Low-sodium or reduced-sodium tomato sauce and tomato paste. Low-sodium or reduced-sodium canned vegetables.  Fruits  All fresh, dried, or frozen fruit. Canned fruit in natural juice (without  added sugar).  Meat and other protein foods  Skinless chicken or turkey. Ground chicken or turkey. Pork with fat trimmed off. Fish and seafood. Egg whites. Dried beans, peas, or lentils. Unsalted nuts, nut butters, and seeds. Unsalted canned beans. Lean cuts of beef with fat trimmed off. Low-sodium, lean deli meat.  Dairy  Low-fat (1%) or fat-free (skim) milk. Fat-free, low-fat, or reduced-fat cheeses. Nonfat, low-sodium ricotta or cottage cheese. Low-fat or nonfat yogurt. Low-fat, low-sodium cheese.  Fats and oils  Soft margarine without trans fats. Vegetable oil. Low-fat, reduced-fat, or light mayonnaise and salad dressings (reduced-sodium). Canola, safflower, olive, soybean, and sunflower oils. Avocado.  Seasoning and other foods  Herbs. Spices. Seasoning mixes without salt. Unsalted popcorn and pretzels. Fat-free sweets.  What foods are not recommended?  The items listed may not be a complete list. Talk with your dietitian about what dietary choices are best for you.  Grains  Baked goods made with fat, such as croissants, muffins, or some breads. Dry pasta or rice meal packs.  Vegetables  Creamed or fried vegetables. Vegetables in a cheese sauce. Regular canned vegetables (not low-sodium or reduced-sodium). Regular canned tomato sauce and paste (not low-sodium or reduced-sodium). Regular tomato and vegetable juice (not low-sodium or reduced-sodium). Pickles. Olives.  Fruits  Canned fruit in a light or heavy syrup. Fried fruit. Fruit in cream or butter sauce.  Meat and other protein foods  Fatty cuts of meat. Ribs. Fried meat. Bacon. Sausage. Bologna and other processed lunch meats. Salami. Fatback. Hotdogs. Bratwurst. Salted nuts and seeds. Canned beans with added salt. Canned or smoked fish. Whole eggs or egg yolks. Chicken or turkey with skin.  Dairy  Whole or 2% milk, cream, and half-and-half. Whole or full-fat cream cheese. Whole-fat or sweetened yogurt. Full-fat cheese. Nondairy creamers. Whipped toppings.  Processed cheese and cheese spreads.  Fats and oils  Butter. Stick margarine. Lard. Shortening. Ghee. Bacon fat. Tropical oils, such as coconut, palm kernel, or palm oil.  Seasoning and other foods  Salted popcorn and pretzels. Onion salt, garlic salt, seasoned salt, table salt, and sea salt. Worcestershire sauce. Tartar sauce. Barbecue sauce. Teriyaki sauce. Soy sauce, including reduced-sodium. Steak sauce. Canned and packaged gravies. Fish sauce. Oyster sauce. Cocktail sauce. Horseradish that you find on the shelf. Ketchup. Mustard. Meat flavorings and tenderizers. Bouillon cubes. Hot sauce and Tabasco sauce. Premade or packaged marinades. Premade or packaged taco seasonings. Relishes. Regular salad dressings.  Where to find more information:   National Heart, Lung, and Blood Institute: www.nhlbi.nih.gov   American Heart Association: www.heart.org  Summary   The DASH eating plan is a healthy eating plan that has been shown to reduce high blood pressure (hypertension). It may also reduce your risk for type 2 diabetes, heart disease, and stroke.   With the   DASH eating plan, you should limit salt (sodium) intake to 2,300 mg a day. If you have hypertension, you may need to reduce your sodium intake to 1,500 mg a day.   When on the DASH eating plan, aim to eat more fresh fruits and vegetables, whole grains, lean proteins, low-fat dairy, and heart-healthy fats.   Work with your health care provider or diet and nutrition specialist (dietitian) to adjust your eating plan to your individual calorie needs.  This information is not intended to replace advice given to you by your health care provider. Make sure you discuss any questions you have with your health care provider.  Document Released: 04/14/2011 Document Revised: 04/18/2016 Document Reviewed: 04/18/2016  Elsevier Interactive Patient Education  2019 [employer]

## 2018-07-02 NOTE — Progress Notes (Signed)
Patient ID: Alison [name], female   DOB: Nov 30, 1975, 43 y.o.   MRN: [id number] History of Present Illness: Alison [name] is a 43 year old white female, married, G2P2, in for well woman gyn exam,she had normal pap with negative HPV 08/08/16. 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 PCP is Dr Dimas Aguas.    Current Medications, Allergies, Past Medical History, Past Surgical History, Family History and Social History were reviewed in [employer] record.     Review of Systems: Patient denies any headaches, hearing loss, fatigue, blurred vision, shortness of breath, chest pain, abdominal pain, problems with bowel movements, urination, or intercourse. No joint pain or mood swings. +Weight gain,started [employer] Heavy periods, with clots, + cramps     Physical Exam:BP (!) 146/93 (BP Location: Right Arm, Patient Position: Sitting, Cuff Size: Large)   Pulse 83   Ht 5' 5.25" (1.657 m)   Wt 245 lb 8 oz (111.4 kg)   LMP 06/18/2018   BMI 40.54 kg/m  General:  Well developed, well nourished, no acute distress Skin:  Warm and dry Neck:  Midline trachea, normal thyroid, good ROM, no lymphadenopathy Lungs; Clear to auscultation bilaterally Breast:  No dominant palpable mass, retraction, or nipple discharge Cardiovascular: Regular rate and rhythm Abdomen:  Soft, non tender, no hepatosplenomegaly Pelvic:  External genitalia is normal in appearance, no lesions.  The vagina is normal in appearance. Urethra has no lesions or masses. The cervix is bulbous.  Uterus is felt to be normal size, shape, and contour.  No adnexal masses or tenderness noted.Bladder is non tender, no masses felt. Rectal: Good sphincter tone, no polyps, or hemorrhoids felt.  Hemoccult negative.+rectocele  Extremities/musculoskeletal:  No swelling or varicosities noted, no clubbing or cyanosis Psych:  No mood changes, alert and cooperative,seems happy Fall risk is low. PHQ 2 score 0. Examination chaperoned by Malachy Mood LPN.    Impression: 1. Encounter for well woman exam with routine gynecological exam   2. Screening for colorectal cancer   3. Menorrhagia with regular cycle   4. Irregular intermenstrual bleeding   5. Abdominal bloating with cramps   6. Elevated BP without diagnosis of hypertension   7. Rectocele       Plan: Return in 2 weeks for GYN [place] Get mammogram Labs with PCP F/U with PCP on BP Continue WW, but review DASH diet too Physical with pap in 1 year Review handout on endometrial ablation

## 2018-07-16 ENCOUNTER — Ambulatory Visit (INDEPENDENT_AMBULATORY_CARE_PROVIDER_SITE_OTHER): Payer: Managed Care, Other (non HMO)

## 2018-07-16 DIAGNOSIS — R140 Abdominal distension (gaseous): Secondary | ICD-10-CM

## 2018-07-16 DIAGNOSIS — N921 Excessive and frequent menstruation with irregular cycle: Secondary | ICD-10-CM

## 2018-07-16 DIAGNOSIS — R109 Unspecified abdominal pain: Secondary | ICD-10-CM

## 2018-07-16 DIAGNOSIS — N920 Excessive and frequent menstruation with regular cycle: Secondary | ICD-10-CM

## 2018-07-16 NOTE — Progress Notes (Signed)
PELVIC US TA/TV:homogeneous anteverted uterus wnl,EEC 12 mm,increased color flow to the endometrium (LUS),no endometrial mass visualized,normal ovaries bilat,ovaries appear mobile,no free fluid,no pain during ultrasound

## 2021-07-08 ENCOUNTER — Encounter: Payer: Self-pay | Admitting: Obstetrics & Gynecology

## 2021-07-08 ENCOUNTER — Ambulatory Visit (INDEPENDENT_AMBULATORY_CARE_PROVIDER_SITE_OTHER): Payer: Self-pay | Admitting: Obstetrics & Gynecology

## 2021-07-08 ENCOUNTER — Other Ambulatory Visit: Payer: Self-pay

## 2021-07-08 ENCOUNTER — Other Ambulatory Visit (HOSPITAL_COMMUNITY)
Admission: RE | Admit: 2021-07-08 | Discharge: 2021-07-08 | Disposition: A | Discharge status: Home / Self Care | Payer: 59 | Source: Ambulatory Visit | Attending: Obstetrics & Gynecology | Admitting: Obstetrics & Gynecology

## 2021-07-08 VITALS — BP 140/89 | HR 93 | Ht 65.0 in | Wt 264.0 lb

## 2021-07-08 DIAGNOSIS — N921 Excessive and frequent menstruation with irregular cycle: Secondary | ICD-10-CM

## 2021-07-08 DIAGNOSIS — Z124 Encounter for screening for malignant neoplasm of cervix: Secondary | ICD-10-CM

## 2021-07-08 LAB — POCT HEMOGLOBIN: Hemoglobin: 13.4 g/dL (ref 11–14.6)

## 2021-07-08 MED ORDER — MEGESTROL ACETATE 40 MG PO TABS: ORAL_TABLET | ORAL | 3 refills | Status: DC

## 2021-07-08 NOTE — Progress Notes (Signed)
Chief Complaint  Patient presents with   Gynecologic Exam      46 y.o. [phone] Patient's last menstrual period was 05/09/2021 (approximate). The current method of family planning is tubal ligation.  Outpatient Encounter Medications as of 07/08/2021  Medication Sig Note   hydrochlorothiazide (HYDRODIURIL) 12.5 MG tablet Take 12.5 mg by mouth daily.    multivitamin (THERAGRAN) tablet Take 1 tablet by mouth daily.    omeprazole (PRILOSEC) 20 MG capsule Take 1 capsule (20 mg total) by mouth 2 (two) times daily before a meal. 1 PO BID (Patient taking differently: Take 20 mg by mouth daily.)    Probiotic Product (PROBIOTIC FORMULA PO) Take 2 tablets by mouth daily.    valsartan (DIOVAN) 40 MG tablet Take 40 mg by mouth daily.    [DISCONTINUED] fexofenadine (ALLEGRA) 180 MG tablet Take 180 mg by mouth daily.    [DISCONTINUED] megestrol (MEGACE) 40 MG tablet 3 tablets a day for 5 days, 2 tablets a day for 5 days then 1 tablet daily 07/27/2021: Pt has reached the 1 tablet per day stage   No facility-administered encounter medications on file as of 07/08/2021.    Subjective Pt with continued heavy menses with intermenstrual spotting, worsening cramping with menses but really pain is not significant Has gotten somewhat worse over time Normal sonogram Past Medical History:  Diagnosis Date   Anxiety    Bacterial overgrowth syndrome 05/09/2010   Bloating 05/09/2010   SEP 2012 HBT C/W SIBO(1ST pk 15, 2nd pk 21)   Ulcer 12/03/2010    Past Surgical History:  Procedure Laterality Date   CESAREAN SECTION  X2   CHOLECYSTECTOMY  APR 2012 [facility]   BILIARY DYSKINESIA, path: CHRONIC CHOLECYSTITIS   DILATATION AND CURETTAGE/HYSTEROSCOPY WITH MINERVA N/A 08/04/2021   Procedure: DILATATION AND CURETTAGE/HYSTEROSCOPY WITH MINERVA;  Surgeon: Lazaro Arms, MD;  Location: [facility];  Service: Gynecology;  Laterality: N/A;   ESOPHAGOGASTRODUODENOSCOPY  12/03/2010   Fields: PUD, Bx NEGATIVE for H pylori    ESOPHAGOGASTRODUODENOSCOPY  03/15/2011   Fields: mild gastritis, PUD resolved   HERNIA REPAIR     HYDROGEN BREATH TEST  01/12/2011   Positive SBBO   INCISIONAL HERNIA REPAIR  12/13/2010   Procedure: HERNIA REPAIR INCISIONAL;  Surgeon: Dalia Heading;  Location: [facility];  Service: General;  Laterality: N/A;  with mesh   INCISIONAL HERNIA REPAIR  01/24/2011   Procedure: HERNIA REPAIR INCISIONAL;  Surgeon: Dalia Heading;  Location: [facility];  Service: General;  Laterality: N/A;  Recurrent Incisional Herniorraphy with Mesh   TUBAL LIGATION      OB History     Gravida  2   Para  2   Term  0   Preterm  2   AB      Living  2      SAB      IAB      Ectopic      Multiple      Live Births              No Known Allergies  Social History   Socioeconomic History   Marital status: Married    Spouse name: Not on file   Number of children: Not on file   Years of education: Not on file   Highest education level: Not on file  Occupational History   Not on file  Tobacco Use   Smoking status: Every Day    Packs/day: 0.50    Years: 10.00  Total pack years: 5.00    Types: Cigarettes   Smokeless tobacco: Never   Tobacco comments:    10 cigarettes daily  Vaping Use   Vaping Use: Never used  Substance and Sexual Activity   Alcohol use: No   Drug use: No   Sexual activity: Yes    Birth control/protection: Surgical    Comment: tubal  Other Topics Concern   Not on file  Social History Narrative   Not on file   Social Determinants of Health   Financial Resource Strain: Low Risk  (07/08/2021)   Overall Financial Resource Strain (CARDIA)    Difficulty of Paying Living Expenses: Not very hard  Food Insecurity: No Food Insecurity (07/08/2021)   Hunger Vital Sign    Worried About Running Out of Food in the Last Year: Never true    Ran Out of Food in the Last Year: Never true  Transportation Needs: No Transportation Needs (07/08/2021)   PRAPARE - Scientist, research (physical sciences) (Medical): No    Lack of Transportation (Non-Medical): No  Physical Activity: Insufficiently Active (07/08/2021)   Exercise Vital Sign    Days of Exercise per Week: 1 day    Minutes of Exercise per Session: 10 min  Stress: No Stress Concern Present (07/08/2021)   [employer] of Occupational Health - Occupational Stress Questionnaire    Feeling of Stress : Only a little  Social Connections: Moderately Integrated (07/08/2021)   Social Connection and Isolation Panel [NHANES]    Frequency of Communication with Friends and Family: Three times a week    Frequency of Social Gatherings with Friends and Family: Once a week    Attends Religious Services: 1 to 4 times per year    Active Member of [employer] or Organizations: No    Attends Engineer, structural: Never    Marital Status: Married    Family History  Problem Relation Age of Onset   Diabetes Maternal Grandfather    Thalassemia Father    Congenital adrenal hyperplasia Son    Anesthesia problems Neg Hx    Hypotension Neg Hx    Malignant hyperthermia Neg Hx    Pseudochol deficiency Neg Hx    Colon cancer Neg Hx     Medications:       Current Outpatient Medications:    hydrochlorothiazide (HYDRODIURIL) 12.5 MG tablet, Take 12.5 mg by mouth daily., Disp: , Rfl:    multivitamin (THERAGRAN) tablet, Take 1 tablet by mouth daily., Disp: , Rfl:    omeprazole (PRILOSEC) 20 MG capsule, Take 1 capsule (20 mg total) by mouth 2 (two) times daily before a meal. 1 PO BID (Patient taking differently: Take 20 mg by mouth daily.), Disp: 60 capsule, Rfl: 5   Probiotic Product (PROBIOTIC FORMULA PO), Take 2 tablets by mouth daily., Disp: , Rfl:    valsartan (DIOVAN) 40 MG tablet, Take 40 mg by mouth daily., Disp: , Rfl:    citalopram (CELEXA) 10 MG tablet, Take 20 mg by mouth daily., Disp: , Rfl:    doxycycline (VIBRA-TABS) 100 MG tablet, Take 1 tablet (100 mg total) by mouth 2 (two) times daily., Disp: 20 tablet, Rfl: 0    Doxylamine Succinate, Sleep, (SLEEP AID PO), Take 1 tablet by mouth at bedtime. (Patient not taking: Reported on 10/12/2021), Disp: , Rfl:    HYDROcodone-acetaminophen (NORCO/VICODIN) 5-325 MG tablet, Take 1 tablet by mouth every 6 (six) hours as needed., Disp: 15 tablet, Rfl: 0   ketorolac (TORADOL) 10 MG  tablet, Take 1 tablet (10 mg total) by mouth every 8 (eight) hours as needed., Disp: 15 tablet, Rfl: 0   ketorolac (TORADOL) 10 MG tablet, Take 1 tablet (10 mg total) by mouth every 8 (eight) hours as needed., Disp: 15 tablet, Rfl: 0   levocetirizine (XYZAL) 5 MG tablet, Take 5 mg by mouth at bedtime., Disp: , Rfl:    norethindrone (MICRONOR) 0.35 MG tablet, Take 1 tablet (0.35 mg total) by mouth daily., Disp: 90 tablet, Rfl: 4   ondansetron (ZOFRAN-ODT) 8 MG disintegrating tablet, Take 1 tablet (8 mg total) by mouth every 8 (eight) hours as needed for nausea or vomiting. (Patient not taking: Reported on 08/12/2021), Disp: 8 tablet, Rfl: 0   potassium chloride SA (KLOR-CON M) 20 MEQ tablet, Take 1 tablet (20 mEq total) by mouth daily., Disp: 30 tablet, Rfl: 11   rosuvastatin (CRESTOR) 5 MG tablet, Take 5 mg by mouth daily., Disp: , Rfl:    WEGOVY 0.25 MG/0.5ML SOAJ, SMARTSIG:0.25 Milligram(s) SUB-Q Once a Week, Disp: , Rfl:   Objective Blood pressure 140/89, pulse 93, height 5\' 5"  (1.651 m), weight 264 lb (119.7 kg), last menstrual period 05/09/2021.  General WDWN female NAD Vulva:  normal appearing vulva with no masses, tenderness or lesions Vagina:  normal mucosa, no discharge Cervix:  Normal no lesions Uterus:  normal size, contour, position, consistency, mobility, non-tender Adnexa: ovaries:present,  normal adnexa in size, nontender and no masses   Pertinent ROS No burning with urination, frequency or urgency No nausea, vomiting or diarrhea Nor fever chills or other constitutional symptoms   Labs or studies Hemoglobin last May 14.2 today 13.2 Reviewed sonogram from [facility] as  well as notes    Impression Diagnoses this Encounter::   ICD-10-CM   1. Menorrhagia with irregular cycle  N92.1 POCT hemoglobin    2. Routine cervical smear  Z12.4 Cytology - PAP      Established relevant diagnosis(es):   Plan/Recommendations: Meds ordered this encounter  Medications   DISCONTD: megestrol (MEGACE) 40 MG tablet    Sig: 3 tablets a day for 5 days, 2 tablets a day for 5 days then 1 tablet daily    Dispense:  45 tablet    Refill:  3    Labs or Scans Ordered: Orders Placed This Encounter  Procedures   POCT hemoglobin    Management:: Hysteroscopy uterine curettage Minerva ablation This is the reasonable first management step given the normal sonogram findings  Follow up Return in about 5 weeks (around 08/13/2021) for 10/13/2021 visit, Post Op, with Dr [employer].       All questions were answered.

## 2021-07-12 DIAGNOSIS — R69 Illness, unspecified: Secondary | ICD-10-CM | POA: Diagnosis not present

## 2021-07-12 DIAGNOSIS — E7849 Other hyperlipidemia: Secondary | ICD-10-CM | POA: Diagnosis not present

## 2021-07-12 DIAGNOSIS — I10 Essential (primary) hypertension: Secondary | ICD-10-CM | POA: Diagnosis not present

## 2021-07-12 DIAGNOSIS — K219 Gastro-esophageal reflux disease without esophagitis: Secondary | ICD-10-CM | POA: Diagnosis not present

## 2021-07-12 DIAGNOSIS — N951 Menopausal and female climacteric states: Secondary | ICD-10-CM | POA: Diagnosis not present

## 2021-07-12 DIAGNOSIS — Z6841 Body Mass Index (BMI) 40.0 and over, adult: Secondary | ICD-10-CM | POA: Diagnosis not present

## 2021-07-14 ENCOUNTER — Encounter: Payer: Managed Care, Other (non HMO) | Admitting: Advanced Practice Midwife

## 2021-07-15 LAB — CYTOLOGY - PAP
Comment: NEGATIVE
Diagnosis: NEGATIVE
High risk HPV: NEGATIVE

## 2021-07-27 DIAGNOSIS — J45909 Unspecified asthma, uncomplicated: Secondary | ICD-10-CM | POA: Diagnosis not present

## 2021-07-27 DIAGNOSIS — R69 Illness, unspecified: Secondary | ICD-10-CM | POA: Diagnosis not present

## 2021-07-27 DIAGNOSIS — J0100 Acute maxillary sinusitis, unspecified: Secondary | ICD-10-CM | POA: Diagnosis not present

## 2021-07-27 DIAGNOSIS — Z6841 Body Mass Index (BMI) 40.0 and over, adult: Secondary | ICD-10-CM | POA: Diagnosis not present

## 2021-07-28 NOTE — Patient Instructions (Addendum)
? Your procedure is scheduled on: 08/04/2021 ? Report to [facility] Main Entrance at   8:00  AM. ? Call this number if you have problems the morning of surgery: [phone] ? ? Remember: ? ? Do not Eat after midnight. ? ?You may have clear liquids until 0530 am on 08/04/2021. ? ?At 0530 am on 08/04/2021 drink your carb drink. You [name] have nothing else to drink after this. ? ?      No Smoking the morning of surgery. ? ? ? : ? Take these medicines the morning of surgery with A SIP OF WATER: Celexa and Allegra ? ? ? ? ? Do not wear jewelry, make-up or nail polish. ? Do not wear lotions, powders, or perfumes. You may wear deodorant. ? Do not shave 48 hours prior to surgery. Men may shave face and neck. ? Do not bring valuables to the hospital. ? Contacts, dentures or bridgework may not be worn into surgery. ? Leave suitcase in the car. After surgery it may be brought to your room. ? For patients admitted to the hospital, checkout time is 11:00 AM the day of discharge. ? ? Patients discharged the day of surgery will not be allowed to drive home. ?  ? Special Instructions: Shower using CHG night before surgery and shower the day of surgery use CHG.  Use special wash - you have one bottle of CHG for all showers.  You should use approximately 1/2 of the bottle for each shower.  ?How to Use Chlorhexidine for Bathing ?Chlorhexidine gluconate (CHG) is a germ-killing (antiseptic) solution that is used to clean the skin. It can get rid of the bacteria that normally live on the skin and can keep them away for about 24 hours. To clean your skin with CHG, you may be given: ?A CHG solution to use in the shower or as part of a sponge bath. ?A prepackaged cloth that contains CHG. ?Cleaning your skin with CHG may help lower the risk for infection: ?While you are staying in the intensive care unit of the hospital. ?If you have a vascular access, such as a central line, to provide short-term or long-term access to your veins. ?If you have  a catheter to drain urine from your bladder. ?If you are on a ventilator. A ventilator is a machine that helps you breathe by moving air in and out of your lungs. ?After surgery. ?What are the risks? ?Risks of using CHG include: ?A skin reaction. ?Hearing loss, if CHG gets in your ears and you have a perforated eardrum. ?Eye injury, if CHG gets in your eyes and is not rinsed out. ?The CHG product catching fire. ?Make sure that you avoid smoking and flames after applying CHG to your skin. ?Do not use CHG: ?If you have a chlorhexidine allergy or have previously reacted to chlorhexidine. ?On babies younger than 42 months of age. ?How to use CHG solution ?Use CHG only as told by your health care provider, and follow the instructions on the label. ?Use the full amount of CHG as directed. Usually, this is one bottle. ?During a shower ?Follow these steps when using CHG solution during a shower (unless your health care provider gives you different instructions): ?Start the shower. ?Use your normal soap and shampoo to wash your face and hair. ?Turn off the shower or move out of the shower stream. ?Pour the CHG onto a clean washcloth. Do not use any type of brush or rough-edged sponge. ?Starting at your neck, lather  your body down to your toes. Make sure you follow these instructions: ?If you will be having surgery, pay special attention to the part of your body where you will be having surgery. Scrub this area for at least 1 minute. ?Do not use CHG on your head or face. If the solution gets into your ears or eyes, rinse them well with water. ?Avoid your genital area. ?Avoid any areas of skin that have broken skin, cuts, or scrapes. ?Scrub your back and under your arms. Make sure to wash skin folds. ?Let the lather sit on your skin for 1-2 minutes or as long as told by your health care provider. ?Thoroughly rinse your entire body in the shower. Make sure that all body creases and crevices are rinsed well. ?Dry off with a clean  towel. Do not put any substances on your body afterward--such as powder, lotion, or perfume--unless you are told to do so by your health care provider. Only use lotions that are recommended by the manufacturer. ?Put on clean clothes or pajamas. ?If it is the night before your surgery, sleep in clean sheets. ? ?During a sponge bath ?Follow these steps when using CHG solution during a sponge bath (unless your health care provider gives you different instructions): ?Use your normal soap and shampoo to wash your face and hair. ?Pour the CHG onto a clean washcloth. ?Starting at your neck, lather your body down to your toes. Make sure you follow these instructions: ?If you will be having surgery, pay special attention to the part of your body where you will be having surgery. Scrub this area for at least 1 minute. ?Do not use CHG on your head or face. If the solution gets into your ears or eyes, rinse them well with water. ?Avoid your genital area. ?Avoid any areas of skin that have broken skin, cuts, or scrapes. ?Scrub your back and under your arms. Make sure to wash skin folds. ?Let the lather sit on your skin for 1-2 minutes or as long as told by your health care provider. ?Using a different clean, wet washcloth, thoroughly rinse your entire body. Make sure that all body creases and crevices are rinsed well. ?Dry off with a clean towel. Do not put any substances on your body afterward--such as powder, lotion, or perfume--unless you are told to do so by your health care provider. Only use lotions that are recommended by the manufacturer. ?Put on clean clothes or pajamas. ?If it is the night before your surgery, sleep in clean sheets. ?How to use CHG prepackaged cloths ?Only use CHG cloths as told by your health care provider, and follow the instructions on the label. ?Use the CHG cloth on clean, dry skin. ?Do not use the CHG cloth on your head or face unless your health care provider tells you to. ?When washing with the  CHG cloth: ?Avoid your genital area. ?Avoid any areas of skin that have broken skin, cuts, or scrapes. ?Before surgery ?Follow these steps when using a CHG cloth to clean before surgery (unless your health care provider gives you different instructions): ?Using the CHG cloth, vigorously scrub the part of your body where you will be having surgery. Scrub using a back-and-forth motion for 3 minutes. The area on your body should be completely wet with CHG when you are done scrubbing. ?Do not rinse. Discard the cloth and let the area air-dry. Do not put any substances on the area afterward, such as powder, lotion, or perfume. ?Put on clean  clothes or pajamas. ?If it is the night before your surgery, sleep in clean sheets. ? ?For general bathing ?Follow these steps when using CHG cloths for general bathing (unless your health care provider gives you different instructions). ?Use a separate CHG cloth for each area of your body. Make sure you wash between any folds of skin and between your fingers and toes. Wash your body in the following order, switching to a new cloth after each step: ?The front of your neck, shoulders, and chest. ?Both of your arms, under your arms, and your hands. ?Your stomach and groin area, avoiding the genitals. ?Your right leg and foot. ?Your left leg and foot. ?The back of your neck, your back, and your buttocks. ?Do not rinse. Discard the cloth and let the area air-dry. Do not put any substances on your body afterward--such as powder, lotion, or perfume--unless you are told to do so by your health care provider. Only use lotions that are recommended by the manufacturer. ?Put on clean clothes or pajamas. ?Contact a health care provider if: ?Your skin gets irritated after scrubbing. ?You have questions about using your solution or cloth. ?You swallow any chlorhexidine. Call your local poison control center ([phone] in the [place] ?Get help right away if: ?Your eyes itch badly, or they become  very red or swollen. ?Your skin itches badly and is red or swollen. ?Your hearing changes. ?You have trouble seeing. ?You have swelling or tingling in your mouth or throat. ?You have trouble breat

## 2021-08-02 ENCOUNTER — Encounter (HOSPITAL_COMMUNITY)
Admission: RE | Admit: 2021-08-02 | Discharge: 2021-08-02 | Disposition: A | Discharge status: Home / Self Care | Payer: 59 | Source: Ambulatory Visit | Attending: Obstetrics & Gynecology | Admitting: Obstetrics & Gynecology

## 2021-08-02 ENCOUNTER — Other Ambulatory Visit: Payer: Self-pay | Admitting: Obstetrics & Gynecology

## 2021-08-02 ENCOUNTER — Other Ambulatory Visit: Payer: Self-pay

## 2021-08-02 ENCOUNTER — Encounter (HOSPITAL_COMMUNITY): Payer: Self-pay

## 2021-08-02 DIAGNOSIS — Z01818 Encounter for other preprocedural examination: Secondary | ICD-10-CM

## 2021-08-02 DIAGNOSIS — Z01812 Encounter for preprocedural laboratory examination: Secondary | ICD-10-CM | POA: Diagnosis present

## 2021-08-02 DIAGNOSIS — Z114 Encounter for screening for human immunodeficiency virus [HIV]: Secondary | ICD-10-CM | POA: Diagnosis not present

## 2021-08-02 HISTORY — DX: Anxiety disorder, unspecified: F41.9

## 2021-08-02 LAB — COMPREHENSIVE METABOLIC PANEL
ALT: 15 U/L (ref 0–44)
AST: 14 U/L — ABNORMAL LOW (ref 15–41)
Albumin: 4.1 g/dL (ref 3.5–5.0)
Alkaline Phosphatase: 72 U/L (ref 38–126)
Anion gap: 8 (ref 5–15)
BUN: 9 mg/dL (ref 6–20)
CO2: 23 mmol/L (ref 22–32)
Calcium: 9.2 mg/dL (ref 8.9–10.3)
Chloride: 107 mmol/L (ref 98–111)
Creatinine, Ser: 0.68 mg/dL (ref 0.44–1.00)
GFR, Estimated: >60 mL/min (ref >60)
Glucose, Bld: 97 mg/dL (ref 70–99)
Potassium: 2.9 mmol/L — ABNORMAL LOW (ref 3.5–5.1)
Sodium: 138 mmol/L (ref 135–145)
Total Bilirubin: 0.6 mg/dL (ref 0.3–1.2)
Total Protein: 7.1 g/dL (ref 6.5–8.1)

## 2021-08-02 LAB — CBC
HCT: 41.9 % (ref 36.0–46.0)
Hemoglobin: 14.2 g/dL (ref 12.0–15.0)
MCH: 30.7 pg (ref 26.0–34.0)
MCHC: 33.9 g/dL (ref 30.0–36.0)
MCV: 90.7 fL (ref 80.0–100.0)
Platelets: 309 10*3/uL (ref 150–400)
RBC: 4.62 MIL/uL (ref 3.87–5.11)
RDW: 13.1 % (ref 11.5–15.5)
WBC: 6.5 10*3/uL (ref 4.0–10.5)
nRBC: 0 % (ref 0.0–0.2)

## 2021-08-02 LAB — URINALYSIS, ROUTINE W REFLEX MICROSCOPIC
Bilirubin Urine: NEGATIVE
Glucose, UA: NEGATIVE mg/dL
Hgb urine dipstick: NEGATIVE
Ketones, ur: NEGATIVE mg/dL
Leukocytes,Ua: NEGATIVE
Nitrite: NEGATIVE
Protein, ur: NEGATIVE mg/dL
Specific Gravity, Urine: 1.01 (ref 1.005–1.030)
pH: 6 (ref 5.0–8.0)

## 2021-08-02 LAB — RAPID HIV SCREEN (HIV 1/2 AB+AG)
HIV 1/2 Antibodies: NONREACTIVE
HIV-1 P24 Antigen - HIV24: NONREACTIVE

## 2021-08-02 LAB — HCG, QUANTITATIVE, PREGNANCY: hCG, Beta Chain, Quant, S: <1 m[IU]/mL (ref <5)

## 2021-08-02 MED ORDER — POTASSIUM CHLORIDE CRYS ER 20 MEQ PO TBCR: 20.0000 meq | EXTENDED_RELEASE_TABLET | Freq: Every day | ORAL | 11 refills | Status: DC

## 2021-08-02 NOTE — Pre-Procedure Instructions (Signed)
Messaged Dr Despina Hidden about potassium of 2.9. ?

## 2021-08-04 ENCOUNTER — Ambulatory Visit (HOSPITAL_COMMUNITY)
Admission: RE | Admit: 2021-08-04 | Discharge: 2021-08-04 | Disposition: A | Discharge status: Home / Self Care | Payer: 59 | Source: Ambulatory Visit | Attending: Obstetrics & Gynecology | Admitting: Obstetrics & Gynecology

## 2021-08-04 ENCOUNTER — Encounter (HOSPITAL_COMMUNITY)
Admission: RE | Disposition: A | Discharge status: Home / Self Care | Payer: Self-pay | Source: Ambulatory Visit | Attending: Obstetrics & Gynecology

## 2021-08-04 ENCOUNTER — Other Ambulatory Visit: Payer: Self-pay

## 2021-08-04 ENCOUNTER — Ambulatory Visit (HOSPITAL_BASED_OUTPATIENT_CLINIC_OR_DEPARTMENT_OTHER): Payer: 59 | Admitting: Certified Registered Nurse Anesthetist

## 2021-08-04 ENCOUNTER — Encounter (HOSPITAL_COMMUNITY): Payer: Self-pay | Admitting: Obstetrics & Gynecology

## 2021-08-04 ENCOUNTER — Ambulatory Visit (HOSPITAL_COMMUNITY): Payer: 59 | Admitting: Certified Registered Nurse Anesthetist

## 2021-08-04 DIAGNOSIS — N923 Ovulation bleeding: Secondary | ICD-10-CM | POA: Insufficient documentation

## 2021-08-04 DIAGNOSIS — F17210 Nicotine dependence, cigarettes, uncomplicated: Secondary | ICD-10-CM | POA: Insufficient documentation

## 2021-08-04 DIAGNOSIS — N920 Excessive and frequent menstruation with regular cycle: Secondary | ICD-10-CM | POA: Diagnosis not present

## 2021-08-04 DIAGNOSIS — Z6841 Body Mass Index (BMI) 40.0 and over, adult: Secondary | ICD-10-CM | POA: Diagnosis not present

## 2021-08-04 DIAGNOSIS — N946 Dysmenorrhea, unspecified: Secondary | ICD-10-CM | POA: Insufficient documentation

## 2021-08-04 DIAGNOSIS — N921 Excessive and frequent menstruation with irregular cycle: Secondary | ICD-10-CM | POA: Diagnosis not present

## 2021-08-04 HISTORY — PX: DILATATION AND CURETTAGE/HYSTEROSCOPY WITH MINERVA: SHX6851

## 2021-08-04 SURGERY — DILATATION AND CURETTAGE/HYSTEROSCOPY WITH MINERVA
Anesthesia: General | Site: Vagina

## 2021-08-04 MED ORDER — POVIDONE-IODINE 10 % EX SWAB: 2.0000 "application " | Freq: Once | CUTANEOUS | Status: DC

## 2021-08-04 MED ORDER — ALBUTEROL SULFATE HFA 108 (90 BASE) MCG/ACT IN AERS
INHALATION_SPRAY | RESPIRATORY_TRACT | Status: DC | PRN
  Administered 2021-08-04 (×2): 6 via RESPIRATORY_TRACT

## 2021-08-04 MED ORDER — ROCURONIUM BROMIDE 10 MG/ML (PF) SYRINGE
PREFILLED_SYRINGE | INTRAVENOUS | Status: AC
  Filled 2021-08-04: qty 10

## 2021-08-04 MED ORDER — ORAL CARE MOUTH RINSE: 15.0000 mL | Freq: Once | OROMUCOSAL | Status: AC

## 2021-08-04 MED ORDER — SUGAMMADEX SODIUM 500 MG/5ML IV SOLN
INTRAVENOUS | Status: AC
  Filled 2021-08-04: qty 5

## 2021-08-04 MED ORDER — LIDOCAINE HCL (PF) 2 % IJ SOLN
INTRAMUSCULAR | Status: AC
  Filled 2021-08-04: qty 5

## 2021-08-04 MED ORDER — MIDAZOLAM HCL 2 MG/2ML IJ SOLN
INTRAMUSCULAR | Status: DC | PRN
  Administered 2021-08-04: 2 mg via INTRAVENOUS

## 2021-08-04 MED ORDER — FENTANYL CITRATE (PF) 100 MCG/2ML IJ SOLN
INTRAMUSCULAR | Status: AC
  Filled 2021-08-04: qty 2

## 2021-08-04 MED ORDER — ALBUTEROL SULFATE HFA 108 (90 BASE) MCG/ACT IN AERS
INHALATION_SPRAY | RESPIRATORY_TRACT | Status: AC
  Filled 2021-08-04: qty 6.7

## 2021-08-04 MED ORDER — MIDAZOLAM HCL 2 MG/2ML IJ SOLN
INTRAMUSCULAR | Status: AC
  Filled 2021-08-04: qty 2

## 2021-08-04 MED ORDER — ONDANSETRON HCL 4 MG/2ML IJ SOLN
4.0000 mg | Freq: Once | INTRAMUSCULAR | Status: DC | PRN
  Filled 2021-08-04: qty 2

## 2021-08-04 MED ORDER — ROCURONIUM BROMIDE 10 MG/ML (PF) SYRINGE
PREFILLED_SYRINGE | INTRAVENOUS | Status: DC | PRN
  Administered 2021-08-04: 30 mg via INTRAVENOUS

## 2021-08-04 MED ORDER — LIDOCAINE HCL (CARDIAC) PF 100 MG/5ML IV SOSY
PREFILLED_SYRINGE | INTRAVENOUS | Status: DC | PRN
  Administered 2021-08-04: 60 mg via INTRATRACHEAL

## 2021-08-04 MED ORDER — PROPOFOL 10 MG/ML IV BOLUS
INTRAVENOUS | Status: DC | PRN
  Administered 2021-08-04: 200 mg via INTRAVENOUS

## 2021-08-04 MED ORDER — ONDANSETRON HCL 4 MG/2ML IJ SOLN
INTRAMUSCULAR | Status: DC | PRN
Start: 2021-08-04 — End: 2021-08-04
  Administered 2021-08-04: 4 mg via INTRAVENOUS

## 2021-08-04 MED ORDER — ONDANSETRON 8 MG PO TBDP: 8.0000 mg | ORAL_TABLET | Freq: Three times a day (TID) | ORAL | 0 refills | Status: DC | PRN

## 2021-08-04 MED ORDER — KETOROLAC TROMETHAMINE 30 MG/ML IJ SOLN
30.0000 mg | Freq: Once | INTRAMUSCULAR | Status: AC
  Administered 2021-08-04: 30 mg via INTRAVENOUS
  Filled 2021-08-04: qty 1

## 2021-08-04 MED ORDER — PROMETHAZINE HCL 25 MG/ML IJ SOLN
INTRAMUSCULAR | Status: AC
  Filled 2021-08-04: qty 1

## 2021-08-04 MED ORDER — KETOROLAC TROMETHAMINE 10 MG PO TABS: 10.0000 mg | ORAL_TABLET | Freq: Three times a day (TID) | ORAL | 0 refills | Status: DC | PRN

## 2021-08-04 MED ORDER — PROMETHAZINE HCL 25 MG/ML IJ SOLN
INTRAMUSCULAR | Status: DC | PRN
Start: 2021-08-04 — End: 2021-08-04
  Administered 2021-08-04: 7.5 mg via INTRAVENOUS

## 2021-08-04 MED ORDER — FENTANYL CITRATE PF 50 MCG/ML IJ SOSY
25.0000 ug | PREFILLED_SYRINGE | INTRAMUSCULAR | Status: DC | PRN
  Administered 2021-08-04: 50 ug via INTRAVENOUS
  Filled 2021-08-04: qty 1

## 2021-08-04 MED ORDER — LACTATED RINGERS IV SOLN: INTRAVENOUS | Status: DC

## 2021-08-04 MED ORDER — FENTANYL CITRATE (PF) 100 MCG/2ML IJ SOLN
INTRAMUSCULAR | Status: DC | PRN
  Administered 2021-08-04: 100 ug via INTRAVENOUS

## 2021-08-04 MED ORDER — SUCCINYLCHOLINE CHLORIDE 200 MG/10ML IV SOSY
PREFILLED_SYRINGE | INTRAVENOUS | Status: DC | PRN
Start: 2021-08-04 — End: 2021-08-04
  Administered 2021-08-04: 140 mg via INTRAVENOUS

## 2021-08-04 MED ORDER — ONDANSETRON HCL 4 MG/2ML IJ SOLN
INTRAMUSCULAR | Status: AC
  Filled 2021-08-04: qty 2

## 2021-08-04 MED ORDER — DEXAMETHASONE SODIUM PHOSPHATE 10 MG/ML IJ SOLN
INTRAMUSCULAR | Status: DC | PRN
  Administered 2021-08-04: 5 mg via INTRAVENOUS

## 2021-08-04 MED ORDER — CEFAZOLIN SODIUM-DEXTROSE 2-4 GM/100ML-% IV SOLN
2.0000 g | INTRAVENOUS | Status: AC
  Administered 2021-08-04: 2 g via INTRAVENOUS
  Filled 2021-08-04: qty 100

## 2021-08-04 MED ORDER — 0.9 % SODIUM CHLORIDE (POUR BTL) OPTIME
TOPICAL | Status: DC | PRN
  Administered 2021-08-04: 1000 mL

## 2021-08-04 MED ORDER — SUGAMMADEX SODIUM 500 MG/5ML IV SOLN
INTRAVENOUS | Status: DC | PRN
  Administered 2021-08-04: 300 mg via INTRAVENOUS

## 2021-08-04 MED ORDER — SUCCINYLCHOLINE CHLORIDE 200 MG/10ML IV SOSY
PREFILLED_SYRINGE | INTRAVENOUS | Status: AC
  Filled 2021-08-04: qty 10

## 2021-08-04 MED ORDER — DEXAMETHASONE SODIUM PHOSPHATE 10 MG/ML IJ SOLN
INTRAMUSCULAR | Status: AC
  Filled 2021-08-04: qty 1

## 2021-08-04 MED ORDER — CHLORHEXIDINE GLUCONATE 0.12 % MT SOLN
15.0000 mL | Freq: Once | OROMUCOSAL | Status: AC
  Administered 2021-08-04: 15 mL via OROMUCOSAL

## 2021-08-04 MED ORDER — SODIUM CHLORIDE 0.9 % IR SOLN
Status: DC | PRN
  Administered 2021-08-04: 3000 mL

## 2021-08-04 SURGICAL SUPPLY — 26 items
BAG HAMPER (MISCELLANEOUS) ×2 IMPLANT
CLOTH BEACON ORANGE TIMEOUT ST (SAFETY) ×2 IMPLANT
COVER LIGHT HANDLE STERIS (MISCELLANEOUS) ×4 IMPLANT
GAUZE 4X4 16PLY ~~LOC~~+RFID DBL (SPONGE) ×4 IMPLANT
GLOVE ECLIPSE 8.0 STRL XLNG CF (GLOVE) ×2 IMPLANT
GLOVE SRG 8 PF TXTR STRL LF DI (GLOVE) ×1 IMPLANT
GLOVE SURG LTX SZ6.5 (GLOVE) ×1 IMPLANT
GLOVE SURG UNDER POLY LF SZ7 (GLOVE) ×4 IMPLANT
GLOVE SURG UNDER POLY LF SZ8 (GLOVE) ×1
GOWN STRL REUS W/TWL LRG LVL3 (GOWN DISPOSABLE) ×2 IMPLANT
GOWN STRL REUS W/TWL XL LVL3 (GOWN DISPOSABLE) ×2 IMPLANT
HANDPIECE ABLA MINERVA ENDO (MISCELLANEOUS) ×2 IMPLANT
INST SET HYSTEROSCOPY (KITS) ×2 IMPLANT
IV NS IRRIG 3000ML ARTHROMATIC (IV SOLUTION) ×1 IMPLANT
KIT TURNOVER CYSTO (KITS) ×2 IMPLANT
MANIFOLD NEPTUNE II (INSTRUMENTS) ×2 IMPLANT
MARKER SKIN DUAL TIP RULER LAB (MISCELLANEOUS) ×2 IMPLANT
NS IRRIG 1000ML POUR BTL (IV SOLUTION) ×2 IMPLANT
PACK BASIC III (CUSTOM PROCEDURE TRAY) ×1
PACK SRG BSC III STRL LF ECLPS (CUSTOM PROCEDURE TRAY) ×1 IMPLANT
PAD ARMBOARD 7.5X6 YLW CONV (MISCELLANEOUS) ×2 IMPLANT
PAD TELFA 3X4 1S STER (GAUZE/BANDAGES/DRESSINGS) ×2 IMPLANT
SET BASIN LINEN APH (SET/KITS/TRAYS/PACK) ×2 IMPLANT
SET CYSTO W/LG BORE CLAMP LF (SET/KITS/TRAYS/PACK) ×2 IMPLANT
SHEET LAVH (DRAPES) ×2 IMPLANT
TUBE CONNECTING 12X1/4 (SUCTIONS) ×2 IMPLANT

## 2021-08-04 NOTE — Anesthesia Preprocedure Evaluation (Signed)
Anesthesia Evaluation  ?Patient identified by MRN, date of birth, ID band ?Patient awake ? ? ? ?Reviewed: ?Allergy & Precautions, H&P , NPO status , Patient's Chart, lab work & pertinent test results, reviewed documented beta blocker date and time  ? ?Airway ?Mallampati: II ? ?TM Distance: >3 FB ?Neck ROM: full ? ? ? Dental ?no notable dental hx. ? ?  ?Pulmonary ?neg pulmonary ROS, Current Smoker and Patient abstained from smoking.,  ?  ?Pulmonary exam normal ?breath sounds clear to auscultation ? ? ? ? ? ? Cardiovascular ?Exercise Tolerance: Good ?negative cardio ROS ? ? ?Rhythm:regular Rate:Normal ? ? ?  ?Neuro/Psych ?PSYCHIATRIC DISORDERS Anxiety negative neurological ROS ?   ? GI/Hepatic ?Neg liver ROS, PUD,   ?Endo/Other  ?Morbid obesity ? Renal/GU ?negative Renal ROS  ?negative genitourinary ?  ?Musculoskeletal ? ? Abdominal ?  ?Peds ? Hematology ?negative hematology ROS ?(+)   ?Anesthesia Other Findings ? ? Reproductive/Obstetrics ?negative OB ROS ? ?  ? ? ? ? ? ? ? ? ? ? ? ? ? ?  ?  ? ? ? ? ? ? ? ? ?Anesthesia Physical ?Anesthesia Plan ? ?ASA: 2 ? ?Anesthesia Plan: General and General ETT  ? ?Post-op Pain Management:   ? ?Induction:  ? ?PONV Risk Score and Plan: Ondansetron ? ?Airway Management Planned:  ? ?Additional Equipment:  ? ?Intra-op Plan:  ? ?Post-operative Plan:  ? ?Informed Consent: I have reviewed the patients History and Physical, chart, labs and discussed the procedure including the risks, benefits and alternatives for the proposed anesthesia with the patient or authorized representative who has indicated his/her understanding and acceptance.  ? ? ? ?Dental Advisory Given ? ?Plan Discussed with: CRNA ? ?Anesthesia Plan Comments:   ? ? ? ? ? ? ?Anesthesia Quick Evaluation ? ?

## 2021-08-04 NOTE — Anesthesia Procedure Notes (Signed)
Procedure Name: Intubation ?Date/Time: 08/04/2021 9:33 AM ?Performed by: Karna Dupes, CRNA ?Pre-anesthesia Checklist: Patient identified, Emergency Drugs available, Suction available and Patient being monitored ?Patient Re-evaluated:Patient Re-evaluated prior to induction ?Oxygen Delivery Method: Circle system utilized ?Preoxygenation: Pre-oxygenation with 100% oxygen ?Induction Type: IV induction ?Ventilation: Mask ventilation without difficulty ?Laryngoscope Size: Mac and 3 ?Grade View: Grade I ?Tube type: Oral ?Tube size: 7.0 mm ?Number of attempts: 1 ?Airway Equipment and Method: Stylet ?Placement Confirmation: ETT inserted through vocal cords under direct vision, positive ETCO2 and breath sounds checked- equal and bilateral ?Secured at: 21 cm ?Tube secured with: Tape ?Dental Injury: Teeth and Oropharynx as per pre-operative assessment  ? ? ? ? ?

## 2021-08-04 NOTE — H&P (Signed)
?Preoperative History and Physical ? ?Alison SchillingsJennifer V [name] is a 46 y.o. [id number] with Patient's last menstrual period was 07/09/2021. admitted for a hysteroscopy uterine curettage Minerva endometrial ablation.  ?Pt with continued heavy menses with intermenstrual spotting, worsening cramping with menses ?Normal sonogram ? ?PMH:    ?Past Medical History:  ?Diagnosis Date  ? Anxiety   ? Bacterial overgrowth syndrome 05/09/2010  ? Bloating 05/09/2010  ? SEP 2012 HBT C/W SIBO(1ST pk 15, 2nd pk 21)  ? Ulcer 12/03/2010  ? ? ?PSH:     ?Past Surgical History:  ?Procedure Laterality Date  ? CESAREAN SECTION  X2  ? CHOLECYSTECTOMY  APR 2012 [facility]  ? BILIARY DYSKINESIA, path: CHRONIC CHOLECYSTITIS  ? ESOPHAGOGASTRODUODENOSCOPY  12/03/2010  ? Fields: PUD, Bx NEGATIVE for H pylori  ? ESOPHAGOGASTRODUODENOSCOPY  03/15/2011  ? Fields: mild gastritis, PUD resolved  ? HERNIA REPAIR    ? HYDROGEN BREATH TEST  01/12/2011  ? Positive SBBO  ? INCISIONAL HERNIA REPAIR  12/13/2010  ? Procedure: HERNIA REPAIR INCISIONAL;  Surgeon: Dalia HeadingMark A Jenkins;  Location: [facility];  Service: General;  Laterality: N/A;  with mesh  ? INCISIONAL HERNIA REPAIR  01/24/2011  ? Procedure: HERNIA REPAIR INCISIONAL;  Surgeon: Dalia HeadingMark A Jenkins;  Location: [facility];  Service: General;  Laterality: N/A;  Recurrent Incisional Herniorraphy with Mesh  ? TUBAL LIGATION    ? ? ?POb/GynH:      ?OB History   ? ? Gravida  ?2  ? Para  ?2  ? Term  ?0  ? Preterm  ?2  ? AB  ?   ? Living  ?2  ?  ? ? SAB  ?   ? IAB  ?   ? Ectopic  ?   ? Multiple  ?   ? Live Births  ?   ?   ?  ?  ? ? ?SH:   ?Social History  ? ?Tobacco Use  ? Smoking status: Every Day  ?  Packs/day: 0.50  ?  Years: 10.00  ?  Pack years: 5.00  ?  Types: Cigarettes  ? Smokeless tobacco: Never  ? Tobacco comments:  ?  10 cigarettes daily  ?Vaping Use  ? Vaping Use: Never used  ?Substance Use Topics  ? Alcohol use: No  ? Drug use: No  ? ? ?FH:    ?Family History  ?Problem Relation Age of Onset  ? Diabetes Maternal Grandfather   ?  Thalassemia Father   ? Congenital adrenal hyperplasia Son   ? Anesthesia problems Neg Hx   ? Hypotension Neg Hx   ? Malignant hyperthermia Neg Hx   ? Pseudochol deficiency Neg Hx   ? Colon cancer Neg Hx   ? ? ? ?Allergies: No Known Allergies ? ?Medications:       ?Current Facility-Administered Medications:  ?  ceFAZolin (ANCEF) IVPB 2g/100 mL premix, 2 g, Intravenous, On Call to OR, Lazaro ArmsEure, Timmya Blazier H, MD ?  povidone-iodine 10 % swab 2 application., 2 application., Topical, Once, Akeiba Axelson, Amaryllis DykeLuther H, MD ? ?Review of Systems:  ? ?Review of Systems  ?Constitutional: Negative for fever, chills, weight loss, malaise/fatigue and diaphoresis.  ?HENT: Negative for hearing loss, ear pain, nosebleeds, congestion, sore throat, neck pain, tinnitus and ear discharge.   ?Eyes: Negative for blurred vision, double vision, photophobia, pain, discharge and redness.  ?Respiratory: Negative for cough, hemoptysis, sputum production, shortness of breath, wheezing and stridor.   ?Cardiovascular: Negative for chest pain, palpitations, orthopnea, claudication, leg swelling and PND.  ?  Gastrointestinal: Positive for abdominal pain. Negative for heartburn, nausea, vomiting, diarrhea, constipation, blood in stool and melena.  ?Genitourinary: Negative for dysuria, urgency, frequency, hematuria and flank pain.  ?Musculoskeletal: Negative for myalgias, back pain, joint pain and falls.  ?Skin: Negative for itching and rash.  ?Neurological: Negative for dizziness, tingling, tremors, sensory change, speech change, focal weakness, seizures, loss of consciousness, weakness and headaches.  ?Endo/Heme/Allergies: Negative for environmental allergies and polydipsia. Does not bruise/bleed easily.  ?Psychiatric/Behavioral: Negative for depression, suicidal ideas, hallucinations, memory loss and substance abuse. The patient is not nervous/anxious and does not have insomnia.   ? ? ? ?PHYSICAL EXAM: ? ?Blood pressure (!) 146/99, pulse 80, temperature 98.2 ?F (36.8 ?C),  temperature source Oral, resp. rate 19, height 5\' 5"  (1.651 m), weight 117.9 kg, last menstrual period 07/09/2021, SpO2 98 %. ? ?  ?Vitals reviewed. ?Constitutional: She is oriented to person, place, and time. She appears well-developed and well-nourished.  ?HENT:  ?Head: Normocephalic and atraumatic.  ?Right Ear: External ear normal.  ?Left Ear: External ear normal.  ?Nose: Nose normal.  ?Mouth/Throat: Oropharynx is clear and moist.  ?Eyes: Conjunctivae and EOM are normal. Pupils are equal, round, and reactive to light. Right eye exhibits no discharge. Left eye exhibits no discharge. No scleral icterus.  ?Neck: Normal range of motion. Neck supple. No tracheal deviation present. No thyromegaly present.  ?Cardiovascular: Normal rate, regular rhythm, normal heart sounds and intact distal pulses.  Exam reveals no gallop and no friction rub.   ?No murmur heard. ?Respiratory: Effort normal and breath sounds normal. No respiratory distress. She has no wheezes. She has no rales. She exhibits no tenderness.  ?GI: Soft. Bowel sounds are normal. She exhibits no distension and no mass. There is tenderness. There is no rebound and no guarding.  ?Genitourinary:  ?     Vulva is normal without lesions ?Vagina is pink moist without discharge ?Cervix normal in appearance and pap is normal ?Uterus is normal size, contour, position, consistency, mobility, non-tender ?Adnexa is negative with normal sized ovaries by sonogram  ?Musculoskeletal: Normal range of motion. She exhibits no edema and no tenderness.  ?Neurological: She is alert and oriented to person, place, and time. She has normal reflexes. She displays normal reflexes. No cranial nerve deficit. She exhibits normal muscle tone. Coordination normal.  ?Skin: Skin is warm and dry. No rash noted. No erythema. No pallor.  ?Psychiatric: She has a normal mood and affect. Her behavior is normal. Judgment and thought content normal.  ? ? ?Labs: ?Results for orders placed or performed  during the hospital encounter of 08/02/21 (from the past 336 hour(s))  ?CBC  ? Collection Time: 08/02/21  8:47 AM  ?Result Value Ref Range  ? WBC 6.5 4.0 - 10.5 K/uL  ? RBC 4.62 3.87 - 5.11 MIL/uL  ? Hemoglobin 14.2 12.0 - 15.0 g/dL  ? HCT 41.9 36.0 - 46.0 %  ? MCV 90.7 80.0 - 100.0 fL  ? MCH 30.7 26.0 - 34.0 pg  ? MCHC 33.9 30.0 - 36.0 g/dL  ? RDW 13.1 11.5 - 15.5 %  ? Platelets 309 150 - 400 K/uL  ? nRBC 0.0 0.0 - 0.2 %  ?Comprehensive metabolic panel  ? Collection Time: 08/02/21  8:47 AM  ?Result Value Ref Range  ? Sodium 138 135 - 145 mmol/L  ? Potassium 2.9 (L) 3.5 - 5.1 mmol/L  ? Chloride 107 98 - 111 mmol/L  ? CO2 23 22 - 32 mmol/L  ? Glucose, Bld 97  70 - 99 mg/dL  ? BUN 9 6 - 20 mg/dL  ? Creatinine, Ser 0.68 0.44 - 1.00 mg/dL  ? Calcium 9.2 8.9 - 10.3 mg/dL  ? Total Protein 7.1 6.5 - 8.1 g/dL  ? Albumin 4.1 3.5 - 5.0 g/dL  ? AST 14 (L) 15 - 41 U/L  ? ALT 15 0 - 44 U/L  ? Alkaline Phosphatase 72 38 - 126 U/L  ? Total Bilirubin 0.6 0.3 - 1.2 mg/dL  ? GFR, Estimated >60 >60 mL/min  ? Anion gap 8 5 - 15  ?hCG, quantitative, pregnancy  ? Collection Time: 08/02/21  8:47 AM  ?Result Value Ref Range  ? hCG, Beta Chain, Quant, S <1 <5 mIU/mL  ?Rapid HIV screen (HIV 1/2 Ab+Ag)  ? Collection Time: 08/02/21  8:47 AM  ?Result Value Ref Range  ? HIV-1 P24 Antigen - HIV24 NON REACTIVE NON REACTIVE  ? HIV 1/2 Antibodies NON REACTIVE NON REACTIVE  ? Interpretation (HIV Ag Ab)    ?  A non reactive test result means that HIV 1 or HIV 2 antibodies and HIV 1 p24 antigen were not detected in the specimen.  ?Urinalysis, Routine w reflex microscopic Urine, Clean Catch  ? Collection Time: 08/02/21  8:47 AM  ?Result Value Ref Range  ? Color, Urine STRAW (A) YELLOW  ? APPearance CLEAR CLEAR  ? Specific Gravity, Urine 1.010 1.005 - 1.030  ? pH 6.0 5.0 - 8.0  ? Glucose, UA NEGATIVE NEGATIVE mg/dL  ? Hgb urine dipstick NEGATIVE NEGATIVE  ? Bilirubin Urine NEGATIVE NEGATIVE  ? Ketones, ur NEGATIVE NEGATIVE mg/dL  ? Protein, ur NEGATIVE  NEGATIVE mg/dL  ? Nitrite NEGATIVE NEGATIVE  ? Leukocytes,Ua NEGATIVE NEGATIVE  ? ? ?EKG: ?No orders found for this or any previous visit. ? ?Imaging Studies: ?No results found. ? ? ? ?Assessment: ?Menorrh

## 2021-08-04 NOTE — Op Note (Signed)
Preoperative diagnosis:  1.   Menorrhagia ?                                        2.  Intermenstrual bleeding ?                                       3.  Dysmenorrhea  ? ?Postoperative diagnoses: Same as above  ? ?Procedure: Hysteroscopy, uterine curettage, endometrial ablation using Minerva ? ?Surgeon: Florian Buff  ? ?Anesthesia: Laryngeal mask airway ? ?Findings: The endometrium was normal. There were no fibroid or other abnormalities. ? ?Description of operation: The patient was taken to the operating room and placed in the supine position. She underwent general anesthesia using the laryngeal mask airway. She was placed in the dorsal lithotomy position and prepped and draped in the usual sterile fashion. A Graves speculum was placed and the anterior cervical lip was grasped with a single-tooth tenaculum. The cervix was dilated serially to allow passage of the hysteroscope. Diagnostic hysteroscopy was performed and was found to be normal. A vigorous uterine curettage was then performed and all tissue sent to pathology for evaluation. ? ?I then proceeded to perform the Minerva endometrial ablation.   ?The uterus sounded to 9.5 cm ?The handpiece was attached to the Minerva power source/machine and the handpiece passed the checklist. ?The array was squeezed down to remove all of the air present.  ?The array was then place into the endometrial cavity and deployed to a length of 6.5 cm. ?The handpiece confirmed appropriate width by being in the green portion of the visual dial. ?The cervical cuff was then inflated to the point the CO2 indicator was in the green. ?The endometrial integrity check was then performed and integrity sequence was confirmed x 2. ?The heating was then begun and carried out for a total of 2 minutes(which is standard therapy time). ?When the plasma cycle was finished,  the cervical cuff was deflated and the array was removed with tissue present on the silicon membrane. ?There was appropriate  post Minerva bleeding and uterine discharge. ? ? ?  All of the equipment worked well throughout the procedure.  The patient was awakened from anesthesia and taken to the recovery room in good stable condition all counts were correct. She received 2 g of Ancef and 30 mg of Toradol preoperatively. She will be discharged from the recovery room and followed up in the office in 1- 2 weeks.  ? ?She can expect 4 weeks of post procedure bloody watery discharge ? ?Florian Buff, MD  ?08/04/2021 ?10:22 AM ? ? ?

## 2021-08-04 NOTE — Transfer of Care (Signed)
Immediate Anesthesia Transfer of Care Note ? ?Patient: Alison [name] ? ?Procedure(s) Performed: DILATATION AND CURETTAGE/HYSTEROSCOPY WITH MINERVA (Vagina ) ? ?Patient Location: PACU ? ?Anesthesia Type:General ? ?Level of Consciousness: awake, alert  and oriented ? ?Airway & Oxygen Therapy: Patient Spontanous Breathing and Patient connected to face mask oxygen ? ?Post-op Assessment: Report given to RN and Post -op Vital signs reviewed and stable ? ?Post vital signs: Reviewed and stable ? ?Last Vitals:  ?Vitals Value Taken Time  ?BP 126/69 08/04/21 1008  ?Temp 36.7 ?C 08/04/21 1008  ?Pulse 77 08/04/21 1008  ?Resp 15 08/04/21 1008  ?SpO2 100 % 08/04/21 1008  ?Vitals shown include unvalidated device data. ? ?Last Pain:  ?Vitals:  ? 08/04/21 0839  ?TempSrc:   ?PainSc: 0-No pain  ?   ? ?Patients Stated Pain Goal: 5 (08/04/21 4481) ? ?Complications: No notable events documented. ?

## 2021-08-05 LAB — SURGICAL PATHOLOGY

## 2021-08-05 NOTE — Anesthesia Postprocedure Evaluation (Signed)
Anesthesia Post Note ? ?Patient: Alison [name] ? ?Procedure(s) Performed: DILATATION AND CURETTAGE/HYSTEROSCOPY WITH MINERVA (Vagina ) ? ?Patient location during evaluation: Phase II ?Anesthesia Type: General ?Level of consciousness: awake ?Pain management: pain level controlled ?Vital Signs Assessment: post-procedure vital signs reviewed and stable ?Respiratory status: spontaneous breathing and respiratory function stable ?Cardiovascular status: blood pressure returned to baseline and stable ?Postop Assessment: no headache and no apparent nausea or vomiting ?Anesthetic complications: no ?Comments: Late entry ? ? ?No notable events documented. ? ? ?Last Vitals:  ?Vitals:  ? 08/04/21 1045 08/04/21 1107  ?BP: 132/76 131/74  ?Pulse: 62 60  ?Resp: 16   ?Temp:  36.8 ?C  ?SpO2: 99% 98%  ?  ?Last Pain:  ?Vitals:  ? 08/04/21 1107  ?TempSrc: Oral  ?PainSc: 3   ? ? ?  ?  ?  ?  ?  ?  ? ?Windell Norfolk ? ? ? ? ?

## 2021-08-06 ENCOUNTER — Encounter (HOSPITAL_COMMUNITY): Payer: Self-pay | Admitting: Obstetrics & Gynecology

## 2021-08-12 ENCOUNTER — Telehealth (INDEPENDENT_AMBULATORY_CARE_PROVIDER_SITE_OTHER): Payer: 59 | Admitting: Obstetrics & Gynecology

## 2021-08-12 ENCOUNTER — Encounter: Payer: Self-pay | Admitting: Obstetrics & Gynecology

## 2021-08-12 DIAGNOSIS — I10 Essential (primary) hypertension: Secondary | ICD-10-CM | POA: Diagnosis not present

## 2021-08-12 DIAGNOSIS — Z6841 Body Mass Index (BMI) 40.0 and over, adult: Secondary | ICD-10-CM | POA: Diagnosis not present

## 2021-08-12 DIAGNOSIS — Z9889 Other specified postprocedural states: Secondary | ICD-10-CM

## 2021-08-12 DIAGNOSIS — R69 Illness, unspecified: Secondary | ICD-10-CM | POA: Diagnosis not present

## 2021-08-12 DIAGNOSIS — K219 Gastro-esophageal reflux disease without esophagitis: Secondary | ICD-10-CM | POA: Diagnosis not present

## 2021-08-12 DIAGNOSIS — E7849 Other hyperlipidemia: Secondary | ICD-10-CM | POA: Diagnosis not present

## 2021-08-12 DIAGNOSIS — N951 Menopausal and female climacteric states: Secondary | ICD-10-CM | POA: Diagnosis not present

## 2021-08-12 NOTE — Progress Notes (Signed)
MyChart video visit: ?Pt is at home ?I am in my office ?Total time 10 minutes ? ?HPI: ?Patient returns for routine postoperative follow-up having undergone hysteroscopy uterine curettage Minerv endometrial ablation on 08/04/21.  ?The patient's immediate postoperative recovery has been unremarkable. ?Since hospital discharge the patient reports no problems. ? ? ?Current Outpatient Medications: ?citalopram (CELEXA) 10 MG tablet, Take 10 mg by mouth daily., Disp: , Rfl:  ?Doxylamine Succinate, Sleep, (SLEEP AID PO), Take 1 tablet by mouth at bedtime., Disp: , Rfl:  ?fexofenadine (ALLEGRA) 180 MG tablet, Take 180 mg by mouth daily., Disp: , Rfl:  ?hydrochlorothiazide (HYDRODIURIL) 12.5 MG tablet, Take 12.5 mg by mouth daily., Disp: , Rfl:  ?multivitamin (THERAGRAN) tablet, Take 1 tablet by mouth daily., Disp: , Rfl:  ?omeprazole (PRILOSEC) 20 MG capsule, Take 1 capsule (20 mg total) by mouth 2 (two) times daily before a meal. 1 PO BID (Patient taking differently: Take 20 mg by mouth daily.), Disp: 60 capsule, Rfl: 5 ?potassium chloride SA (KLOR-CON M) 20 MEQ tablet, Take 1 tablet (20 mEq total) by mouth daily., Disp: 30 tablet, Rfl: 11 ?Probiotic Product (PROBIOTIC FORMULA PO), Take 2 tablets by mouth daily., Disp: , Rfl:  ?valsartan (DIOVAN) 40 MG tablet, Take 40 mg by mouth daily., Disp: , Rfl:  ?ketorolac (TORADOL) 10 MG tablet, Take 1 tablet (10 mg total) by mouth every 8 (eight) hours as needed. (Patient not taking: Reported on 08/12/2021), Disp: 15 tablet, Rfl: 0 ?ondansetron (ZOFRAN-ODT) 8 MG disintegrating tablet, Take 1 tablet (8 mg total) by mouth every 8 (eight) hours as needed for nausea or vomiting. (Patient not taking: Reported on 08/12/2021), Disp: 8 tablet, Rfl: 0 ? ?No current facility-administered medications for this visit. ? ? ? ?There were no vitals taken for this visit. ? ?Physical Exam: ?N/a ? ?Diagnostic Tests: ? ? ?Pathology: ?benign ? ?Impression + Management plan: ?  ICD-10-CM   ?1. Post-operative  state  Z98.890   ?  ? ? ? ? ? ?Medications Prescribed this encounter: ?No orders of the defined types were placed in this encounter. ? ? ? ? ?Follow up: ?No follow-ups on file.  ? ? ?Lazaro Arms, MD ?Attending Physician for the [facility] for [facility] and ?[facility] ?08/12/2021 ?4:37 PM ? ? ? ? ?

## 2021-08-16 ENCOUNTER — Encounter: Payer: Self-pay | Admitting: Obstetrics & Gynecology

## 2021-08-25 ENCOUNTER — Encounter: Payer: Self-pay | Admitting: Obstetrics & Gynecology

## 2021-09-20 ENCOUNTER — Other Ambulatory Visit: Payer: Self-pay | Admitting: Family Medicine

## 2021-09-20 DIAGNOSIS — Z1231 Encounter for screening mammogram for malignant neoplasm of breast: Secondary | ICD-10-CM

## 2021-09-21 DIAGNOSIS — I10 Essential (primary) hypertension: Secondary | ICD-10-CM | POA: Diagnosis not present

## 2021-09-21 DIAGNOSIS — K219 Gastro-esophageal reflux disease without esophagitis: Secondary | ICD-10-CM | POA: Diagnosis not present

## 2021-09-21 DIAGNOSIS — N951 Menopausal and female climacteric states: Secondary | ICD-10-CM | POA: Diagnosis not present

## 2021-09-21 DIAGNOSIS — R69 Illness, unspecified: Secondary | ICD-10-CM | POA: Diagnosis not present

## 2021-09-21 DIAGNOSIS — E782 Mixed hyperlipidemia: Secondary | ICD-10-CM | POA: Diagnosis not present

## 2021-09-21 DIAGNOSIS — Z6841 Body Mass Index (BMI) 40.0 and over, adult: Secondary | ICD-10-CM | POA: Diagnosis not present

## 2021-10-05 ENCOUNTER — Ambulatory Visit
Admission: RE | Admit: 2021-10-05 | Discharge: 2021-10-05 | Disposition: A | Discharge status: Home / Self Care | Payer: 59 | Source: Ambulatory Visit

## 2021-10-05 DIAGNOSIS — Z1231 Encounter for screening mammogram for malignant neoplasm of breast: Secondary | ICD-10-CM

## 2021-10-12 ENCOUNTER — Ambulatory Visit (INDEPENDENT_AMBULATORY_CARE_PROVIDER_SITE_OTHER): Payer: 59 | Admitting: Obstetrics & Gynecology

## 2021-10-12 VITALS — BP 141/89 | HR 91 | Ht 65.0 in | Wt 262.0 lb

## 2021-10-12 DIAGNOSIS — N9985 Post endometrial ablation syndrome: Secondary | ICD-10-CM | POA: Diagnosis not present

## 2021-10-12 MED ORDER — KETOROLAC TROMETHAMINE 10 MG PO TABS
10.0000 mg | ORAL_TABLET | Freq: Three times a day (TID) | ORAL | 0 refills | Status: DC | PRN
Start: 2021-10-12 — End: 2023-06-17

## 2021-10-12 NOTE — Progress Notes (Signed)
Chief Complaint  Patient presents with   Follow-up    Really bad cramps      46 y.o. [id number] No LMP recorded. Patient has had an ablation. The current method of family planning is BTL.  Outpatient Encounter Medications as of 10/12/2021  Medication Sig   citalopram (CELEXA) 10 MG tablet Take 20 mg by mouth daily.   hydrochlorothiazide (HYDRODIURIL) 12.5 MG tablet Take 12.5 mg by mouth daily.   ketorolac (TORADOL) 10 MG tablet Take 1 tablet (10 mg total) by mouth every 8 (eight) hours as needed.   ketorolac (TORADOL) 10 MG tablet Take 1 tablet (10 mg total) by mouth every 8 (eight) hours as needed.   levocetirizine (XYZAL) 5 MG tablet Take 5 mg by mouth at bedtime.   multivitamin (THERAGRAN) tablet Take 1 tablet by mouth daily.   omeprazole (PRILOSEC) 20 MG capsule Take 1 capsule (20 mg total) by mouth 2 (two) times daily before a meal. 1 PO BID (Patient taking differently: Take 20 mg by mouth daily.)   potassium chloride SA (KLOR-CON M) 20 MEQ tablet Take 1 tablet (20 mEq total) by mouth daily.   Probiotic Product (PROBIOTIC FORMULA PO) Take 2 tablets by mouth daily.   rosuvastatin (CRESTOR) 5 MG tablet Take 5 mg by mouth daily.   valsartan (DIOVAN) 40 MG tablet Take 40 mg by mouth daily.   WEGOVY 0.25 MG/0.5ML SOAJ SMARTSIG:0.25 Milligram(s) SUB-Q Once a Week   Doxylamine Succinate, Sleep, (SLEEP AID PO) Take 1 tablet by mouth at bedtime. (Patient not taking: Reported on 10/12/2021)   ondansetron (ZOFRAN-ODT) 8 MG disintegrating tablet Take 1 tablet (8 mg total) by mouth every 8 (eight) hours as needed for nausea or vomiting. (Patient not taking: Reported on 08/12/2021)   [DISCONTINUED] fexofenadine (ALLEGRA) 180 MG tablet Take 180 mg by mouth daily.   No facility-administered encounter medications on file as of 10/12/2021.    Subjective Pt with 3-4 days of severe cramps in April and started again yesterday No bleeding Endometrial ablation 3/23 Never had cramps with her cycle,  occasional ovulation cramps Unsure if she is on her "cycle" or not  Past Medical History:  Diagnosis Date   Anxiety    Bacterial overgrowth syndrome 05/09/2010   Bloating 05/09/2010   SEP 2012 HBT C/W SIBO(1ST pk 15, 2nd pk 21)   Ulcer 12/03/2010    Past Surgical History:  Procedure Laterality Date   CESAREAN SECTION  X2   CHOLECYSTECTOMY  APR 2012 [facility]   BILIARY DYSKINESIA, path: CHRONIC CHOLECYSTITIS   DILATATION AND CURETTAGE/HYSTEROSCOPY WITH MINERVA N/A 08/04/2021   Procedure: DILATATION AND CURETTAGE/HYSTEROSCOPY WITH MINERVA;  Surgeon: Lazaro ArmsEure, Dellene Mcgroarty H, MD;  Location: [facility];  Service: Gynecology;  Laterality: N/A;   ESOPHAGOGASTRODUODENOSCOPY  12/03/2010   Fields: PUD, Bx NEGATIVE for H pylori   ESOPHAGOGASTRODUODENOSCOPY  03/15/2011   Fields: mild gastritis, PUD resolved   HERNIA REPAIR     HYDROGEN BREATH TEST  01/12/2011   Positive SBBO   INCISIONAL HERNIA REPAIR  12/13/2010   Procedure: HERNIA REPAIR INCISIONAL;  Surgeon: Dalia HeadingMark A Jenkins;  Location: [facility];  Service: General;  Laterality: N/A;  with mesh   INCISIONAL HERNIA REPAIR  01/24/2011   Procedure: HERNIA REPAIR INCISIONAL;  Surgeon: Dalia HeadingMark A Jenkins;  Location: [facility];  Service: General;  Laterality: N/A;  Recurrent Incisional Herniorraphy with Mesh   TUBAL LIGATION      OB History     Gravida  2   Para  2  Term  0   Preterm  2   AB      Living  2      SAB      IAB      Ectopic      Multiple      Live Births              No Known Allergies  Social History   Socioeconomic History   Marital status: Married    Spouse name: Not on file   Number of children: Not on file   Years of education: Not on file   Highest education level: Not on file  Occupational History   Not on file  Tobacco Use   Smoking status: Every Day    Packs/day: 0.50    Years: 10.00    Pack years: 5.00    Types: Cigarettes   Smokeless tobacco: Never   Tobacco comments:    10 cigarettes daily  Vaping Use    Vaping Use: Never used  Substance and Sexual Activity   Alcohol use: No   Drug use: No   Sexual activity: Yes    Birth control/protection: Surgical    Comment: tubal  Other Topics Concern   Not on file  Social History Narrative   Not on file   Social Determinants of Health   Financial Resource Strain: Low Risk    Difficulty of Paying Living Expenses: Not very hard  Food Insecurity: No Food Insecurity   Worried About Running Out of Food in the Last Year: Never true   Ran Out of Food in the Last Year: Never true  Transportation Needs: No Transportation Needs   Lack of Transportation (Medical): No   Lack of Transportation (Non-Medical): No  Physical Activity: Insufficiently Active   Days of Exercise per Week: 1 day   Minutes of Exercise per Session: 10 min  Stress: No Stress Concern Present   Feeling of Stress : Only a little  Social Connections: Moderately Integrated   Frequency of Communication with Friends and Family: Three times a week   Frequency of Social Gatherings with Friends and Family: Once a week   Attends Religious Services: 1 to 4 times per year   Active Member of [employer] or Organizations: No   Attends Engineer, structural: Never   Marital Status: Married    Family History  Problem Relation Age of Onset   Diabetes Maternal Grandfather    Thalassemia Father    Congenital adrenal hyperplasia Son    Anesthesia problems Neg Hx    Hypotension Neg Hx    Malignant hyperthermia Neg Hx    Pseudochol deficiency Neg Hx    Colon cancer Neg Hx     Medications:       Current Outpatient Medications:    citalopram (CELEXA) 10 MG tablet, Take 20 mg by mouth daily., Disp: , Rfl:    hydrochlorothiazide (HYDRODIURIL) 12.5 MG tablet, Take 12.5 mg by mouth daily., Disp: , Rfl:    ketorolac (TORADOL) 10 MG tablet, Take 1 tablet (10 mg total) by mouth every 8 (eight) hours as needed., Disp: 15 tablet, Rfl: 0   ketorolac (TORADOL) 10 MG tablet, Take 1 tablet (10 mg  total) by mouth every 8 (eight) hours as needed., Disp: 15 tablet, Rfl: 0   levocetirizine (XYZAL) 5 MG tablet, Take 5 mg by mouth at bedtime., Disp: , Rfl:    multivitamin (THERAGRAN) tablet, Take 1 tablet by mouth daily., Disp: , Rfl:    omeprazole (  PRILOSEC) 20 MG capsule, Take 1 capsule (20 mg total) by mouth 2 (two) times daily before a meal. 1 PO BID (Patient taking differently: Take 20 mg by mouth daily.), Disp: 60 capsule, Rfl: 5   potassium chloride SA (KLOR-CON M) 20 MEQ tablet, Take 1 tablet (20 mEq total) by mouth daily., Disp: 30 tablet, Rfl: 11   Probiotic Product (PROBIOTIC FORMULA PO), Take 2 tablets by mouth daily., Disp: , Rfl:    rosuvastatin (CRESTOR) 5 MG tablet, Take 5 mg by mouth daily., Disp: , Rfl:    valsartan (DIOVAN) 40 MG tablet, Take 40 mg by mouth daily., Disp: , Rfl:    WEGOVY 0.25 MG/0.5ML SOAJ, SMARTSIG:0.25 Milligram(s) SUB-Q Once a Week, Disp: , Rfl:    Doxylamine Succinate, Sleep, (SLEEP AID PO), Take 1 tablet by mouth at bedtime. (Patient not taking: Reported on 10/12/2021), Disp: , Rfl:    ondansetron (ZOFRAN-ODT) 8 MG disintegrating tablet, Take 1 tablet (8 mg total) by mouth every 8 (eight) hours as needed for nausea or vomiting. (Patient not taking: Reported on 08/12/2021), Disp: 8 tablet, Rfl: 0  Objective Blood pressure (!) 141/89, pulse 91, height 5\' 5"  (1.651 m), weight 262 lb (118.8 kg).  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    Dispense:  15 tablet    Refill:  0    Labs or Scans Ordered during this encounter: No orders of the defined types were placed in this encounter.     Follow up No follow-ups on file.

## 2021-10-13 ENCOUNTER — Encounter: Payer: Self-pay | Admitting: Obstetrics & Gynecology

## 2021-10-13 ENCOUNTER — Emergency Department (HOSPITAL_COMMUNITY)
Admission: EM | Admit: 2021-10-13 | Discharge: 2021-10-14 | Disposition: A | Discharge status: Home / Self Care | Payer: 59 | Attending: Emergency Medicine | Admitting: Emergency Medicine

## 2021-10-13 ENCOUNTER — Encounter (HOSPITAL_COMMUNITY): Payer: Self-pay

## 2021-10-13 ENCOUNTER — Other Ambulatory Visit: Payer: Self-pay

## 2021-10-13 DIAGNOSIS — R102 Pelvic and perineal pain: Secondary | ICD-10-CM | POA: Diagnosis present

## 2021-10-13 LAB — URINALYSIS, ROUTINE W REFLEX MICROSCOPIC
Bilirubin Urine: NEGATIVE
Glucose, UA: NEGATIVE mg/dL
Hgb urine dipstick: NEGATIVE
Ketones, ur: NEGATIVE mg/dL
Leukocytes,Ua: NEGATIVE
Nitrite: NEGATIVE
Protein, ur: NEGATIVE mg/dL
Specific Gravity, Urine: 1.009 (ref 1.005–1.030)
pH: 5 (ref 5.0–8.0)

## 2021-10-13 MED ORDER — HYDROCODONE-ACETAMINOPHEN 5-325 MG PO TABS: 1.0000 | ORAL_TABLET | Freq: Four times a day (QID) | ORAL | 0 refills | Status: DC | PRN

## 2021-10-13 NOTE — ED Provider Triage Note (Signed)
Emergency Medicine Provider Triage Evaluation Note  Alison [name] , a 46 y.o. female  was evaluated in triage.  Pt complains of lower abdominal/pelvic pain ongoing for about couple days.  Rates it as severe.  Seen her GYN and had her cervix dilated yesterday.  She was prescribed Toradol however has not had any improvement.  She was prescribed hydrocodone today but was unable to pick this up.  She is concerned something else may be going on and presented tonight for evaluation given ongoing pain as well as second opinion.  Denies nausea, vomiting, dysuria, or fever.  Review of Systems  Positive: As above Negative: As above  Physical Exam  BP (!) 167/93 (BP Location: Right Arm)   Pulse 72   Temp 98 F (36.7 C)   Resp 17   Ht 5\' 5"  (1.651 m)   Wt 118.8 kg   SpO2 99%   BMI 43.60 kg/m  Gen:   Awake, no distress   Resp:  Normal effort  MSK:   Moves extremities without difficulty  Other:  Abdomen without tenderness to palpation.  Without flank tenderness  Medical Decision Making  Medically screening exam initiated at 11:07 PM.  Appropriate orders placed.  Alison [name] was informed that the remainder of the evaluation will be completed by another provider, this initial triage assessment does not replace that evaluation, and the importance of remaining in the ED until their evaluation is complete.     Cay Schillings, PA-C 10/13/21 2308

## 2021-10-13 NOTE — ED Triage Notes (Signed)
Pt reports having an ablation back in march. Has had increased pelvic pain radiating to back. Went to [facility] yesterday and was told cervix has closed up. Sts they attempted to re-open with a "rod".

## 2021-10-14 ENCOUNTER — Other Ambulatory Visit: Payer: Self-pay | Admitting: Obstetrics & Gynecology

## 2021-10-14 ENCOUNTER — Telehealth: Payer: Self-pay | Admitting: *Deleted

## 2021-10-14 ENCOUNTER — Emergency Department (HOSPITAL_COMMUNITY)
Admission: EM | Admit: 2021-10-14 | Discharge: 2021-10-14 | Disposition: A | Discharge status: Home / Self Care | Payer: 59 | Source: Home / Self Care

## 2021-10-14 ENCOUNTER — Encounter (HOSPITAL_COMMUNITY): Payer: Self-pay | Admitting: Emergency Medicine

## 2021-10-14 ENCOUNTER — Telehealth: Payer: Self-pay

## 2021-10-14 ENCOUNTER — Telehealth: Payer: Self-pay | Admitting: Obstetrics & Gynecology

## 2021-10-14 ENCOUNTER — Emergency Department (HOSPITAL_COMMUNITY): Payer: 59

## 2021-10-14 DIAGNOSIS — R1030 Lower abdominal pain, unspecified: Secondary | ICD-10-CM | POA: Insufficient documentation

## 2021-10-14 DIAGNOSIS — N921 Excessive and frequent menstruation with irregular cycle: Secondary | ICD-10-CM

## 2021-10-14 DIAGNOSIS — R102 Pelvic and perineal pain: Secondary | ICD-10-CM | POA: Diagnosis not present

## 2021-10-14 DIAGNOSIS — Z5321 Procedure and treatment not carried out due to patient leaving prior to being seen by health care provider: Secondary | ICD-10-CM | POA: Insufficient documentation

## 2021-10-14 DIAGNOSIS — N9985 Post endometrial ablation syndrome: Secondary | ICD-10-CM

## 2021-10-14 LAB — I-STAT BETA HCG BLOOD, ED (MC, WL, AP ONLY): I-stat hCG, quantitative: <5 m[IU]/mL (ref <5)

## 2021-10-14 LAB — COMPREHENSIVE METABOLIC PANEL
ALT: 14 U/L (ref 0–44)
AST: 23 U/L (ref 15–41)
Albumin: 3.8 g/dL (ref 3.5–5.0)
Alkaline Phosphatase: 72 U/L (ref 38–126)
Anion gap: 9 (ref 5–15)
BUN: 6 mg/dL (ref 6–20)
CO2: 23 mmol/L (ref 22–32)
Calcium: 9.3 mg/dL (ref 8.9–10.3)
Chloride: 101 mmol/L (ref 98–111)
Creatinine, Ser: 0.66 mg/dL (ref 0.44–1.00)
GFR, Estimated: >60 mL/min (ref >60)
Glucose, Bld: 97 mg/dL (ref 70–99)
Potassium: 3.1 mmol/L — ABNORMAL LOW (ref 3.5–5.1)
Sodium: 133 mmol/L — ABNORMAL LOW (ref 135–145)
Total Bilirubin: 0.3 mg/dL (ref 0.3–1.2)
Total Protein: 6.4 g/dL — ABNORMAL LOW (ref 6.5–8.1)

## 2021-10-14 LAB — CBC WITH DIFFERENTIAL/PLATELET
Abs Immature Granulocytes: 0.01 10*3/uL (ref 0.00–0.07)
Basophils Absolute: 0 10*3/uL (ref 0.0–0.1)
Basophils Relative: 1 %
Eosinophils Absolute: 0.2 10*3/uL (ref 0.0–0.5)
Eosinophils Relative: 3 %
HCT: 39.6 % (ref 36.0–46.0)
Hemoglobin: 13.8 g/dL (ref 12.0–15.0)
Immature Granulocytes: 0 %
Lymphocytes Relative: 31 %
Lymphs Abs: 2.1 10*3/uL (ref 0.7–4.0)
MCH: 31.8 pg (ref 26.0–34.0)
MCHC: 34.8 g/dL (ref 30.0–36.0)
MCV: 91.2 fL (ref 80.0–100.0)
Monocytes Absolute: 0.7 10*3/uL (ref 0.1–1.0)
Monocytes Relative: 10 %
Neutro Abs: 3.7 10*3/uL (ref 1.7–7.7)
Neutrophils Relative %: 55 %
Platelets: 266 10*3/uL (ref 150–400)
RBC: 4.34 MIL/uL (ref 3.87–5.11)
RDW: 13.5 % (ref 11.5–15.5)
WBC: 6.7 10*3/uL (ref 4.0–10.5)
nRBC: 0 % (ref 0.0–0.2)

## 2021-10-14 MED ORDER — NORETHINDRONE 0.35 MG PO TABS: 1.0000 | ORAL_TABLET | Freq: Every day | ORAL | 4 refills | Status: DC

## 2021-10-14 MED ORDER — SLYND 4 MG PO TABS: 1.0000 | ORAL_TABLET | Freq: Every day | ORAL | 4 refills | Status: DC

## 2021-10-14 NOTE — Telephone Encounter (Signed)
Pt called and stated that Dr. Nelda Marseille was supposed to send her prescription in to the pharmacy. But the pharmacy stated that never received it.  Pt would like for the prescription to be sent.

## 2021-10-14 NOTE — Telephone Encounter (Signed)
Called patient back- she is at an urgent care/Emergency room in [place] due to the pain.  Reviewed Dr. Forestine Chute note, she was just seen on 6/6.  He had advised started on progesterone management.  Rx sent in, pt advised to start and schedule follow up in 2-3 mos to see if this has helped improve her symptoms.  Myna Hidalgo, DO Attending Obstetrician & Gynecologist, [facility] for [employer], [facility]

## 2021-10-14 NOTE — Telephone Encounter (Signed)
Patient calling stating that she is in pain still, wanting to know if you would call her, she states that [facility] called her something in for pain but still is having pain and thinks that there is something wrong. She surgery back in March

## 2021-10-14 NOTE — Telephone Encounter (Signed)
Left message @ 3:11 pm. JSY 

## 2021-10-14 NOTE — Addendum Note (Signed)
Addended by: Sharon Seller on: 10/14/2021 01:25 PM   Modules accepted: Orders

## 2021-10-14 NOTE — ED Triage Notes (Signed)
Pt c/o lower abd pain after appt with OB yesterday where they told her cervix had closed up and they attempt to re-open it with a "rod".

## 2021-10-14 NOTE — Telephone Encounter (Signed)
Pt states the MyScripts app says the Slynd is $57.00. She needs something cheaper. Please advise. Thanks! JSY

## 2021-10-14 NOTE — Progress Notes (Signed)
Cost of medication change to Norethindrone

## 2021-10-15 NOTE — Telephone Encounter (Signed)
Pt aware a different med was sent to pharmacy in place of [place]. Pt voiced understanding. [place]

## 2021-10-18 MED ORDER — DOXYCYCLINE HYCLATE 100 MG PO TABS: 100.0000 mg | ORAL_TABLET | Freq: Two times a day (BID) | ORAL | 0 refills | Status: DC

## 2021-10-18 NOTE — Addendum Note (Signed)
Addended by: Florian Buff on: 10/18/2021 06:56 AM   Modules accepted: Orders

## 2022-08-09 ENCOUNTER — Other Ambulatory Visit: Payer: Self-pay | Admitting: Family Medicine

## 2022-08-09 ENCOUNTER — Telehealth: Payer: Self-pay | Admitting: Gastroenterology

## 2022-08-09 DIAGNOSIS — Z1231 Encounter for screening mammogram for malignant neoplasm of breast: Secondary | ICD-10-CM

## 2022-08-09 NOTE — Telephone Encounter (Signed)
Longstanding GI problems-I have reviewed Dr. Barney Drain notes She does need colonoscopy Next due office visit with [facility] clinic or mine before-routine RG

## 2022-08-09 NOTE — Telephone Encounter (Signed)
Good afternoon Dr. Lyndel Safe  The following patient has previous GI history with [facility]. She wants to get a colonoscopy done because she is of age. She has had no affiliation with [facility] since having procedures done about 10 years ago and just wants to establish care with a GI provider. Records are available for review in Epic. Please review and advise of scheduling.  Thank you

## 2022-08-12 ENCOUNTER — Encounter: Payer: Self-pay | Admitting: Physician Assistant

## 2022-08-12 NOTE — Telephone Encounter (Signed)
Call to schedule. Left VM

## 2022-10-06 ENCOUNTER — Ambulatory Visit: Payer: 59 | Admitting: Physician Assistant

## 2022-10-10 ENCOUNTER — Ambulatory Visit
Admission: RE | Admit: 2022-10-10 | Discharge: 2022-10-10 | Disposition: A | Discharge status: Home / Self Care | Payer: BC Managed Care – PPO | Source: Ambulatory Visit | Attending: Family Medicine | Admitting: Family Medicine

## 2022-10-10 DIAGNOSIS — Z1231 Encounter for screening mammogram for malignant neoplasm of breast: Secondary | ICD-10-CM

## 2022-10-11 NOTE — Progress Notes (Unsigned)
10/12/2022 Alison [name] [id number] January 12, 1976  Referring provider: [name], PA-C Primary GI doctor: Dr. Chales [name]  ASSESSMENT AND PLAN:   GERD with history of peptic ulcer disease 2012 secondary to NSAIDs She quit smoking 8 months ago, congratulated patient as this will help decrease reflux and ulcers Patient is still taking Toradol twice a week for menstrual cramping Only on Prilosec 20 mg in the morning occasionally will wait 2 hours to eat Lifestyle changes discussed, avoid NSAIDS, ETOH Will increase PPI to 40 mg once daily emphasizing 30 minutes before food Will schedule EGD. I discussed risks of EGD with patient today, including risk of sedation, bleeding or perforation.  Patient provides understanding and gave verbal consent to proceed.  Constipation/diarrhea, worse status post cholecystectomy, laxative dependent Patient states she takes laxatives every night for years, has no hard stools, only soft or loose stools but without the laxative she can go 2 to 3 days without a bowel movement with associated abdominal pain, nausea vomiting, and bloating Was treated for SIBO in the past without much help No family history of colon cancer, no family history of IBD Patient also has some urinary symptoms sounds like there could be a pelvic floor prolapse component  -Check celiac panel, check CRP and sed rate to rule out IBD with loose stools -Will start patient on Linzess 290 mcg, consider adding on versus switching to Motegrity depending on results.  Patient has been laxative dependent, failed MiraLAX, fiber, stool softeners -Will schedule for colonoscopy to evaluate for IBD/malignancy We have discussed the risks of bleeding, infection, perforation, medication reactions, and remote risk of death associated with colonoscopy. All questions were answered and the patient acknowledges these risk and wishes to proceed. -If negative consider pelvic floor component versus retesting for  small intestinal bacterial overgrowth  Patient is on Wegovy, planning on getting off this medication for weight loss, discussed will need to stop 7 days prior to endoscopic evaluation. Patient appropriate for [facility]  Patient Care Team: Alison Moris, PA-C as PCP - General (Family Medicine) [place] Bali, MD (Inactive) (Gastroenterology)  HISTORY OF PRESENT ILLNESS: 47 y.o. female with a past medical history of peptic ulcer disease, alternating diarrhea constipation, SIBO, and others listed below presents for evaluation of GERD, constipation/diarrhea/AB pain.   Patient previously seen at [facility]. GI history of alternating constipation diarrhea, abdominal bloating.  Previously positive hydrogen breath test 12/2012 treat empirically with antibiotics. 03/2011 EGD mild gastritis, peptic ulcer disease. Patient with history of dysmenorrhea, had endometrial ablation 07/29/2021. Has 2 kids and 2 grandkids.  She states GYN several years ago told her she had prolapse.  She states she has taken laxatives, gentle laxative [employer] brand, nightly for years.  Has BM once to multiple times a day, always soft/loose stools. Occ the laxative will not work, can have 2-3 days of constipation with AB pain, nausea, took miralax and more laxative and was finally able to have BM.  She states since having her GB out years ago, she can have BM 10-20 mins after having BM. Very rare waking up at night with diarrhea.  She takes prilosec 20 mg once daily, in the morning eating 1-2 hours afterwards. She has been having worsening GERD, she has to take pepto frequently. he has nausea, no vomiting, worse with wegovy. Can have some nocturnal GERD that wakes her up.  No melena, no hematochezia. No dysphagia. S On medication history has Toradol, will take 1-2 pills a month for uterine cramps/AB pain.  She quit smoking 8 months ago.  She has been on wegovy for the last year, going to stop soon, insurance no longer paying, has  lost 60 lbs.  States previous SIBO treatment did not help.   Patient denies family history of colon cancer or other gastrointestinal malignancies. Sister with IGA def.  She denies blood thinner use.  She reports NSAID use.  She denies ETOH use.   She denies tobacco use.  She denies drug use.    She  reports that she has been smoking cigarettes. She has a 5.00 pack-year smoking history. She has never used smokeless tobacco. She reports that she does not drink alcohol and does not use drugs.  RELEVANT LABS AND IMAGING: CBC    Component Value Date/Time   WBC 6.7 10/13/2021 2322   RBC 4.34 10/13/2021 2322   HGB 13.8 10/13/2021 2322   HCT 39.6 10/13/2021 2322   PLT 266 10/13/2021 2322   MCV 91.2 10/13/2021 2322   MCH 31.8 10/13/2021 2322   MCHC 34.8 10/13/2021 2322   RDW 13.5 10/13/2021 2322   LYMPHSABS 2.1 10/13/2021 2322   MONOABS 0.7 10/13/2021 2322   EOSABS 0.2 10/13/2021 2322   BASOSABS 0.0 10/13/2021 2322   Recent Labs    10/13/21 2322  HGB 13.8    CMP     Component Value Date/Time   NA 133 (L) 10/13/2021 2322   K 3.1 (L) 10/13/2021 2322   CL 101 10/13/2021 2322   CO2 23 10/13/2021 2322   GLUCOSE 97 10/13/2021 2322   BUN 6 10/13/2021 2322   CREATININE 0.66 10/13/2021 2322   CALCIUM 9.3 10/13/2021 2322   PROT 6.4 (L) 10/13/2021 2322   ALBUMIN 3.8 10/13/2021 2322   AST 23 10/13/2021 2322   ALT 14 10/13/2021 2322   ALKPHOS 72 10/13/2021 2322   BILITOT 0.3 10/13/2021 2322   GFRNONAA >60 10/13/2021 2322   GFRAA >60 01/19/2011 1033      Latest Ref Rng & Units 10/13/2021   11:22 PM 08/02/2021    8:47 AM 12/05/2010    8:48 AM  Hepatic Function  Total Protein 6.5 - 8.1 g/dL 6.4  7.1  7.3   Albumin 3.5 - 5.0 g/dL 3.8  4.1  3.7   AST 15 - 41 U/L 23  14  19    ALT 0 - 44 U/L 14  15  28    Alk Phosphatase 38 - 126 U/L 72  72  78   Total Bilirubin 0.3 - 1.2 mg/dL 0.3  0.6  0.2       Current Medications:   Current Outpatient Medications (Endocrine & Metabolic):     norethindrone (MICRONOR) 0.35 MG tablet, Take 1 tablet (0.35 mg total) by mouth daily.  Current Outpatient Medications (Cardiovascular):    hydrochlorothiazide (HYDRODIURIL) 12.5 MG tablet, Take 12.5 mg by mouth daily.   rosuvastatin (CRESTOR) 5 MG tablet, Take 5 mg by mouth daily.   valsartan (DIOVAN) 40 MG tablet, Take 40 mg by mouth daily.  Current Outpatient Medications (Respiratory):    levocetirizine (XYZAL) 5 MG tablet, Take 5 mg by mouth at bedtime.  Current Outpatient Medications (Analgesics):    ketorolac (TORADOL) 10 MG tablet, Take 1 tablet (10 mg total) by mouth every 8 (eight) hours as needed.   ketorolac (TORADOL) 10 MG tablet, Take 1 tablet (10 mg total) by mouth every 8 (eight) hours as needed.   Current Outpatient Medications (Other):    citalopram (CELEXA) 10 MG tablet, Take 20 mg by  mouth daily.   multivitamin (THERAGRAN) tablet, Take 1 tablet by mouth daily.   ondansetron (ZOFRAN-ODT) 8 MG disintegrating tablet, Take 1 tablet (8 mg total) by mouth every 8 (eight) hours as needed for nausea or vomiting.   Probiotic Product (PROBIOTIC FORMULA PO), Take 2 tablets by mouth daily.   WEGOVY 0.25 MG/0.5ML SOAJ, SMARTSIG:0.25 Milligram(s) SUB-Q Once a Week   omeprazole (PRILOSEC) 40 MG capsule, Take 1 capsule (40 mg total) by mouth daily.  Medical History:  Past Medical History:  Diagnosis Date   Anxiety    Bacterial overgrowth syndrome 05/09/2010   Bloating 05/09/2010   SEP 2012 HBT C/W SIBO(1ST pk 15, 2nd pk 21)   Ulcer 12/03/2010   Allergies: No Known Allergies   Surgical History:  She  has a past surgical history that includes Cesarean section (X2); Cholecystectomy (APR 2012 [facility]); Tubal ligation; Esophagogastroduodenoscopy (12/03/2010); Incisional hernia repair (12/13/2010); Hernia repair; hydrogen breath test (01/12/2011); Incisional hernia repair (01/24/2011); Esophagogastroduodenoscopy (03/15/2011); and Dilatation and curettage/hysteroscopy with minerva (N/A,  08/04/2021). Family History:  Her family history includes Congenital adrenal hyperplasia in her son; Diabetes in her maternal grandfather; Thalassemia in her father.  REVIEW OF SYSTEMS  : All other systems reviewed and negative except where noted in the History of Present Illness.  PHYSICAL EXAM: BP 120/80   Pulse 90   Ht 5\' 6"  (1.676 m)   Wt 222 lb (100.7 kg)   BMI 35.83 kg/m  General Appearance: Well nourished, in no apparent distress. Head:   Normocephalic and atraumatic. Eyes:  sclerae anicteric,conjunctive pink  Respiratory: Respiratory effort normal, BS equal bilaterally without rales, rhonchi, wheezing. Cardio: RRR with no MRGs. Peripheral pulses intact.  Abdomen: Soft,  Obese ,active bowel sounds. mild tenderness in the lower abdomen. Without guarding and Without rebound. No masses. Rectal: Not evaluated Musculoskeletal: Full ROM, Normal gait. Without edema. Skin:  Dry and intact without significant lesions or rashes Neuro: Alert and  oriented x4;  No focal deficits. Psych:  Cooperative. Normal mood and affect.    Doree Albee, PA-C 9:09 AM

## 2022-10-12 ENCOUNTER — Ambulatory Visit: Payer: BC Managed Care – PPO | Admitting: Physician Assistant

## 2022-10-12 ENCOUNTER — Other Ambulatory Visit (INDEPENDENT_AMBULATORY_CARE_PROVIDER_SITE_OTHER): Payer: BC Managed Care – PPO

## 2022-10-12 ENCOUNTER — Telehealth: Payer: Self-pay | Admitting: Physician Assistant

## 2022-10-12 ENCOUNTER — Encounter: Payer: Self-pay | Admitting: Physician Assistant

## 2022-10-12 VITALS — BP 120/80 | HR 90 | Ht 66.0 in | Wt 222.0 lb

## 2022-10-12 DIAGNOSIS — K279 Peptic ulcer, site unspecified, unspecified as acute or chronic, without hemorrhage or perforation: Secondary | ICD-10-CM | POA: Diagnosis not present

## 2022-10-12 DIAGNOSIS — K5900 Constipation, unspecified: Secondary | ICD-10-CM

## 2022-10-12 DIAGNOSIS — R140 Abdominal distension (gaseous): Secondary | ICD-10-CM

## 2022-10-12 DIAGNOSIS — K219 Gastro-esophageal reflux disease without esophagitis: Secondary | ICD-10-CM

## 2022-10-12 DIAGNOSIS — R109 Unspecified abdominal pain: Secondary | ICD-10-CM | POA: Diagnosis not present

## 2022-10-12 LAB — CBC WITH DIFFERENTIAL/PLATELET
Basophils Absolute: 0.1 10*3/uL (ref 0.0–0.1)
Basophils Relative: 1 % (ref 0.0–3.0)
Eosinophils Absolute: 0.1 10*3/uL (ref 0.0–0.7)
Eosinophils Relative: 2.4 % (ref 0.0–5.0)
HCT: 41.7 % (ref 36.0–46.0)
Hemoglobin: 14.4 g/dL (ref 12.0–15.0)
Lymphocytes Relative: 23.5 % (ref 12.0–46.0)
Lymphs Abs: 1.4 10*3/uL (ref 0.7–4.0)
MCHC: 34.4 g/dL (ref 30.0–36.0)
MCV: 93.2 fl (ref 78.0–100.0)
Monocytes Absolute: 0.5 10*3/uL (ref 0.1–1.0)
Monocytes Relative: 8.6 % (ref 3.0–12.0)
Neutro Abs: 3.8 10*3/uL (ref 1.4–7.7)
Neutrophils Relative %: 64.5 % (ref 43.0–77.0)
Platelets: 296 10*3/uL (ref 150.0–400.0)
RBC: 4.48 Mil/uL (ref 3.87–5.11)
RDW: 13 % (ref 11.5–15.5)
WBC: 5.9 10*3/uL (ref 4.0–10.5)

## 2022-10-12 LAB — HIGH SENSITIVITY CRP: CRP, High Sensitivity: 1.43 mg/L (ref 0.000–5.000)

## 2022-10-12 LAB — COMPREHENSIVE METABOLIC PANEL
ALT: 15 U/L (ref 0–35)
AST: 16 U/L (ref 0–37)
Albumin: 4.6 g/dL (ref 3.5–5.2)
Alkaline Phosphatase: 63 U/L (ref 39–117)
BUN: 10 mg/dL (ref 6–23)
CO2: 23 mEq/L (ref 19–32)
Calcium: 9.7 mg/dL (ref 8.4–10.5)
Chloride: 103 mEq/L (ref 96–112)
Creatinine, Ser: 0.68 mg/dL (ref 0.40–1.20)
GFR: 103.76 mL/min (ref >60.00)
Glucose, Bld: 85 mg/dL (ref 70–99)
Potassium: 3.7 mEq/L (ref 3.5–5.1)
Sodium: 138 mEq/L (ref 135–145)
Total Bilirubin: 0.4 mg/dL (ref 0.2–1.2)
Total Protein: 7.5 g/dL (ref 6.0–8.3)

## 2022-10-12 LAB — SEDIMENTATION RATE: Sed Rate: 15 mm/hr (ref 0–20)

## 2022-10-12 MED ORDER — LINACLOTIDE 290 MCG PO CAPS: 290.0000 ug | ORAL_CAPSULE | Freq: Every day | ORAL | 0 refills | Status: DC

## 2022-10-12 MED ORDER — NA SULFATE-K SULFATE-MG SULF 17.5-3.13-1.6 GM/177ML PO SOLN: 1.0000 | Freq: Once | ORAL | 0 refills | Status: AC

## 2022-10-12 MED ORDER — OMEPRAZOLE 40 MG PO CPDR: 40.0000 mg | DELAYED_RELEASE_CAPSULE | Freq: Every day | ORAL | 0 refills | Status: AC

## 2022-10-12 NOTE — Patient Instructions (Addendum)
Your provider has requested that you go to the basement level for lab work before leaving today. Press "B" on the elevator. The lab is located at the first door on the left as you exit the elevator.   Please take your proton pump inhibitor medication, prilosec 40 mg once day  Please take this medication 30 minutes to 1 hour before meals- this makes it more effective.  Avoid spicy and acidic foods Avoid fatty foods Limit your intake of coffee, tea, alcohol, and carbonated drinks Work to maintain a healthy weight Keep the head of the bed elevated at least 3 inches with blocks or a wedge pillow if you are having any nighttime symptoms Stay upright for 2 hours after eating Avoid meals and snacks three to four hours before bedtime  Linzess 290- can try laxative every other day or 2-3 x a week with it at first If this does not help, call or message about motegrity *IBS-C patients may begin to experience relief from belly pain and overall abdominal symptoms (pain, discomfort, and bloating) in about 1 week,  with symptoms typically improving over 12 weeks.  Take at least 30 minutes before the first meal of the day on an empty stomach You can have a loose stool if you eat a high-fat breakfast. Give it at least 7 days, may have more bowel movements during that time.   The diarrhea should go away and you should start having normal, complete, full bowel movements.  It may be helpful to start treatment when you can be near the comfort of your own bathroom, such as a weekend.  After you are out we can send in a prescription if you did well, there is a prescription card   Here some information about pelvic floor dysfunction. This may be contributing to some of your symptoms. We could also refer to pelvic floor physical therapy.   Pelvic Floor Dysfunction, Female Pelvic floor dysfunction (PFD) is a condition that results when the group of muscles and connective tissues that support the organs in the  pelvis (pelvic floor muscles) do not work well. These muscles and their connections form a sling that supports the colon and bladder. In women, they also support the uterus. PFD causes pelvic floor muscles to be too weak, too tight, or both. In PFD, muscle movements are not coordinated. This may cause bowel or bladder problems. It may also cause pain. What are the causes? This condition may be caused by an injury to the pelvic area or by a weakening of pelvic muscles. This often results from pregnancy and childbirth or other types of strain. In many cases, the exact cause is not known. What increases the risk? The following factors may make you more likely to develop this condition: Having chronic bladder tissue inflammation (interstitial cystitis). Being an older person. Being overweight. History of radiation treatment for cancer in the pelvic region. Previous pelvic surgery, such as removal of the uterus (hysterectomy). What are the signs or symptoms? Symptoms of this condition vary and may include: Bladder symptoms, such as: Trouble starting urination and emptying the bladder. Frequent urinary tract infections. Leaking urine when coughing, laughing, or exercising (stress incontinence). Having to pass urine urgently or frequently. Pain when passing urine. Bowel symptoms, such as: Constipation. Urgent or frequent bowel movements. Incomplete bowel movements. Painful bowel movements. Leaking stool or gas. Unexplained genital or rectal pain. Genital or rectal muscle spasms. Low back pain. Other symptoms may include: A heavy, full, or aching feeling in  the vagina. A bulge that protrudes into the vagina. Pain during or after sex. How is this diagnosed? This condition may be diagnosed based on: Your symptoms and medical history. A physical exam. During the exam, your health care provider may check your pelvic muscles for tightness, spasm, pain, or weakness. This may include a rectal  exam and a pelvic exam. In some cases, you may have diagnostic tests, such as: Electrical muscle function tests. Urine flow testing. X-ray tests of bowel function. Ultrasound of the pelvic organs. How is this treated? Treatment for this condition depends on the symptoms. Treatment options include: Physical therapy. This may include Kegel exercises to help relax or strengthen the pelvic floor muscles. Biofeedback. This type of therapy provides feedback on how tight your pelvic floor muscles are so that you can learn to control them. Internal or external massage therapy. A treatment that involves electrical stimulation of the pelvic floor muscles to help control pain (transcutaneous electrical nerve stimulation, or TENS). Sound wave therapy (ultrasound) to reduce muscle spasms. Medicines, such as: Muscle relaxants. Bladder control medicines. Surgery to reconstruct or support pelvic floor muscles may be an option if other treatments do not help. Follow these instructions at home: Activity Do your usual activities as told by your health care provider. Ask your health care provider if you should modify any activities. Do pelvic floor strengthening or relaxing exercises at home as told by your physical therapist. Lifestyle Maintain a healthy weight. Eat foods that are high in fiber, such as beans, whole grains, and fresh fruits and vegetables. Limit foods that are high in fat and processed sugars, such as fried or sweet foods. Manage stress with relaxation techniques such as yoga or meditation. General instructions If you have problems with leakage: Use absorbable pads or wear padded underwear. Wash frequently with mild soap. Keep your genital and anal area as clean and dry as possible. Ask your health care provider if you should try a barrier cream to prevent skin irritation. Take warm baths to relieve pelvic muscle tension or spasms. Take over-the-counter and prescription medicines only  as told by your health care provider. Keep all follow-up visits. How is this prevented? The cause of PFD is not always known, but there are a few things you can do to reduce the risk of developing this condition, including: Staying at a healthy weight. Getting regular exercise. Managing stress. Contact a health care provider if: Your symptoms are not improving with home care. You have signs or symptoms of PFD that get worse at home. You develop new signs or symptoms. You have signs of a urinary tract infection, such as: Fever. Chills. Increased urinary frequency. A burning feeling when urinating. You have not had a bowel movement in 3 days (constipation). Summary Pelvic floor dysfunction results when the muscles and connective tissues in your pelvic floor do not work well. These muscles and their connections form a sling that supports your colon and bladder. In women, they also support the uterus. PFD may be caused by an injury to the pelvic area or by a weakening of pelvic muscles. PFD causes pelvic floor muscles to be too weak, too tight, or a combination of both. Symptoms may vary from person to person. In most cases, PFD can be treated with physical therapies and medicines. Surgery may be an option if other treatments do not help. This information is not intended to replace advice given to you by your health care provider. Make sure you discuss any questions you  have with your health care provider. Document Revised: 09/02/2020 Document Reviewed: 09/02/2020 Elsevier Patient Education  2022 [employer]vier [employer].  You have been scheduled for a colonoscopy/EGD. Please follow written instructions given to you at your visit today.  Please pick up your prep supplies at the pharmacy within the next 1-3 days. If you use inhalers (even only as needed), please bring them with you on the day of your procedure.  _______________________________________________________  If your blood pressure at your  visit was 140/90 or greater, please contact your primary care physician to follow up on this.  _______________________________________________________  If you are age 18 or older, your body mass index should be between 23-30. Your Body mass index is 35.83 kg/m. If this is out of the aforementioned range listed, please consider follow up with your Primary Care Provider.  If you are age 2 or younger, your body mass index should be between 19-25. Your Body mass index is 35.83 kg/m. If this is out of the aformentioned range listed, please consider follow up with your Primary Care Provider.   ________________________________________________________  The [facility] providers would like to encourage you to use [facility] to communicate with providers for non-urgent requests or questions.  Due to long hold times on the telephone, sending your provider a message by [facility] may be a faster and more efficient way to get a response.  Please allow 48 business hours for a response.  Please remember that this is for non-urgent requests.  _______________________________________________________ It was a pleasure to see you today!  Thank you for trusting me with your gastrointestinal care!

## 2022-10-12 NOTE — Telephone Encounter (Signed)
Inbound call from patient stating she needs prescriptions sent into the pharmacy.  Omeprazole? Linzess? Prep kit?  Please advise. Thank you

## 2022-10-12 NOTE — Addendum Note (Signed)
Addended by: Sunday Spillers on: 10/12/2022 01:34 PM   Modules accepted: Orders

## 2022-10-13 LAB — IGA: Immunoglobulin A: 196 mg/dL (ref 47–310)

## 2022-10-13 LAB — TISSUE TRANSGLUTAMINASE, IGA: (tTG) Ab, IgA: <1 U/mL

## 2022-10-18 NOTE — Progress Notes (Signed)
Agree with assessment/plan.  Raj Presly Steinruck, MD [facility] [phone]  

## 2022-11-09 ENCOUNTER — Other Ambulatory Visit: Payer: Self-pay | Admitting: Physician Assistant

## 2022-11-09 MED ORDER — MOTEGRITY 2 MG PO TABS: 2.0000 mg | ORAL_TABLET | Freq: Every day | ORAL | 0 refills | Status: DC

## 2022-11-09 NOTE — Telephone Encounter (Signed)
Patient has been laxative dependent, failed MiraLAX, fiber, stool softeners, linzess 290 Lets try motegrity samples or send in motegrity for the patient to take once a day.  Sent into her pharmacy, can take with miralax.

## 2022-11-15 ENCOUNTER — Other Ambulatory Visit: Payer: Self-pay

## 2022-11-15 DIAGNOSIS — Z1211 Encounter for screening for malignant neoplasm of colon: Secondary | ICD-10-CM

## 2022-11-15 DIAGNOSIS — K5900 Constipation, unspecified: Secondary | ICD-10-CM

## 2022-11-16 ENCOUNTER — Other Ambulatory Visit: Payer: Self-pay

## 2022-11-16 ENCOUNTER — Telehealth: Payer: Self-pay

## 2022-11-16 MED ORDER — TRULANCE 3 MG PO TABS: 3.0000 mg | ORAL_TABLET | Freq: Every day | ORAL | 0 refills | Status: DC

## 2022-11-16 NOTE — Telephone Encounter (Signed)
Med has been discontinued, please sign off.

## 2022-11-16 NOTE — Telephone Encounter (Signed)
Called patients pharmacy at patients request regarding PA being sent- pharmacy notified me that no PA is needed and Motegrity is not covered by insurance at all, insurance is advising [employer] instead-notified patient of this  .

## 2022-11-17 ENCOUNTER — Encounter: Payer: Self-pay | Admitting: Gastroenterology

## 2022-11-24 IMAGING — MG MM DIGITAL SCREENING BILAT W/ TOMO AND CAD
8 of 14 series · 8 of 40 positions shown · non-contrast
Comparison: Previous exam(s).

CLINICAL DATA: Screening.

EXAM:
DIGITAL SCREENING BILATERAL MAMMOGRAM WITH TOMOSYNTHESIS AND CAD
TECHNIQUE: Bilateral screening digital craniocaudal and mediolateral oblique
mammograms were obtained. Bilateral screening digital breast
tomosynthesis was performed. The images were evaluated with
computer-aided detection.

[R MLO synth-2D (1 of 2)]
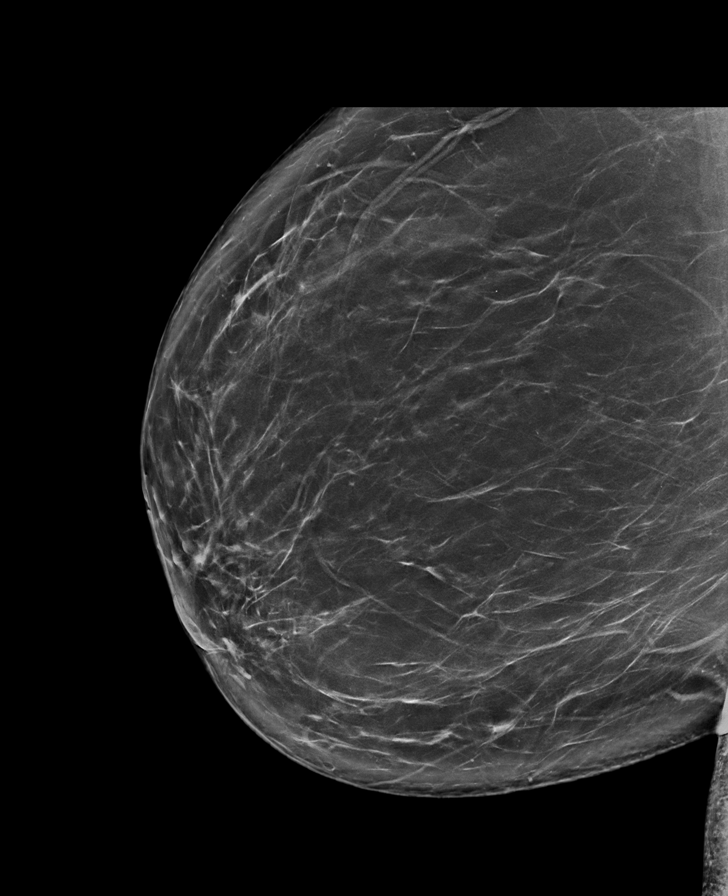

[L CC synth-2D]
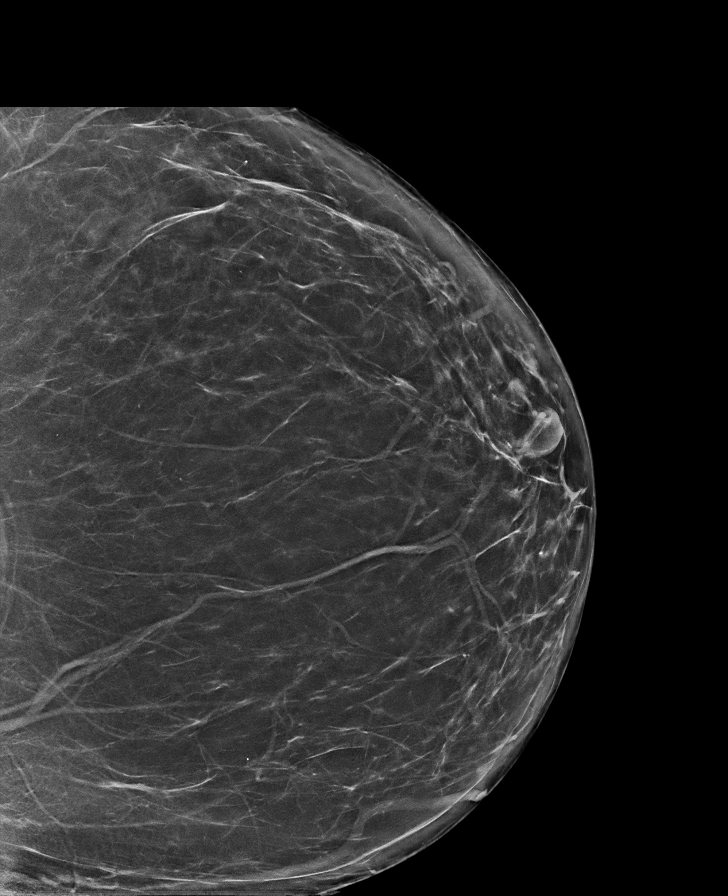

[R CC synth-2D]
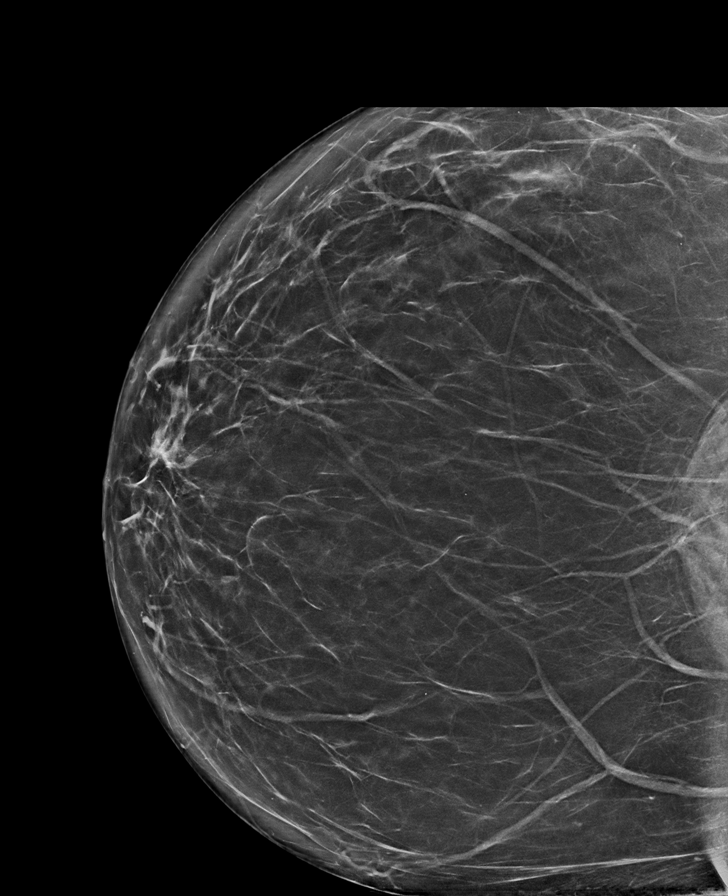

[L MLO synth-2D (1 of 2)]
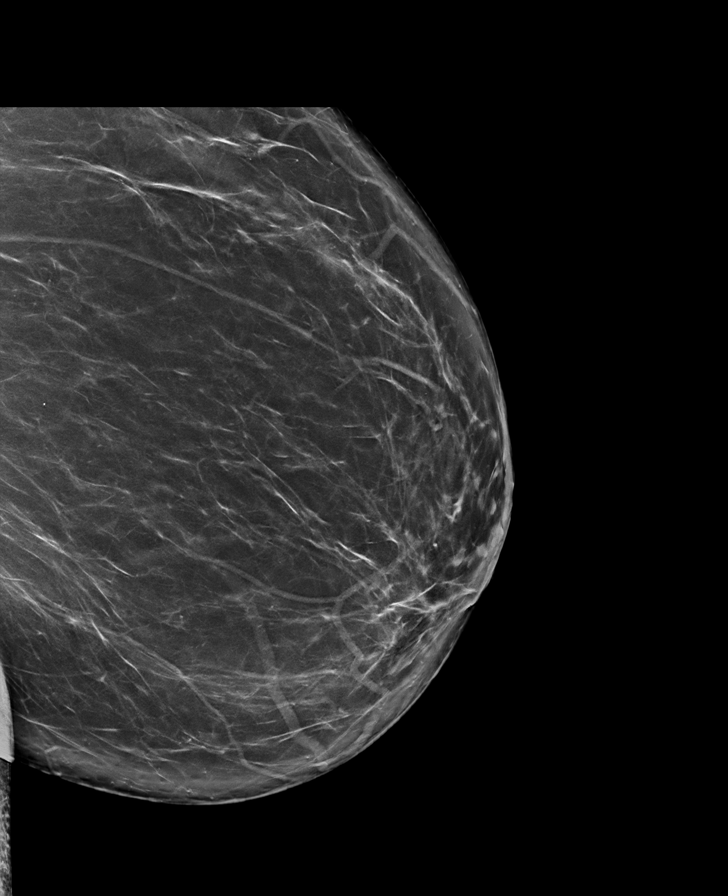

[R MLO synth-2D (2 of 2)]
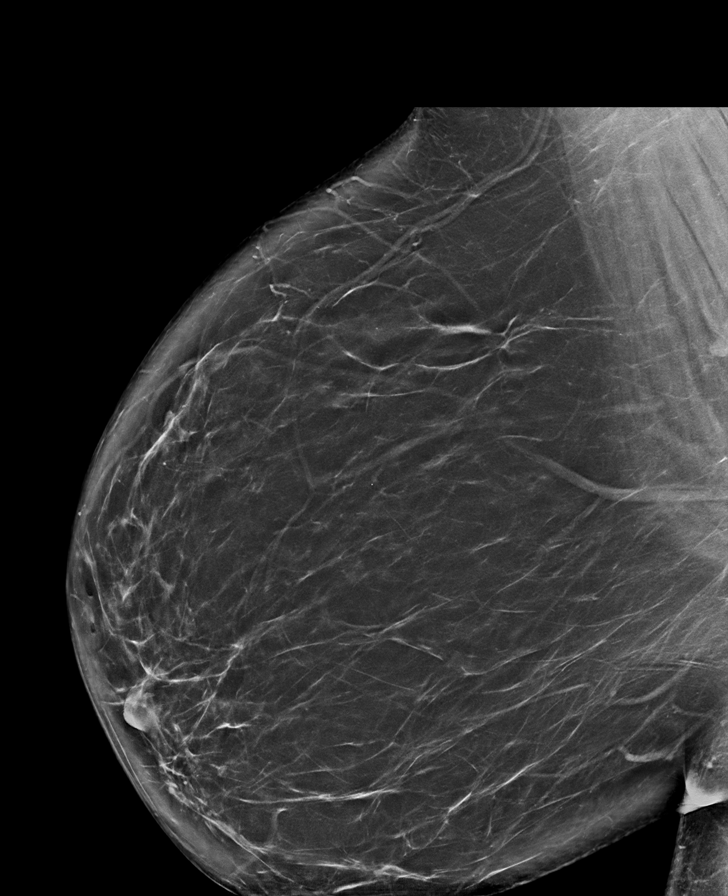

[R CV synth-2D]
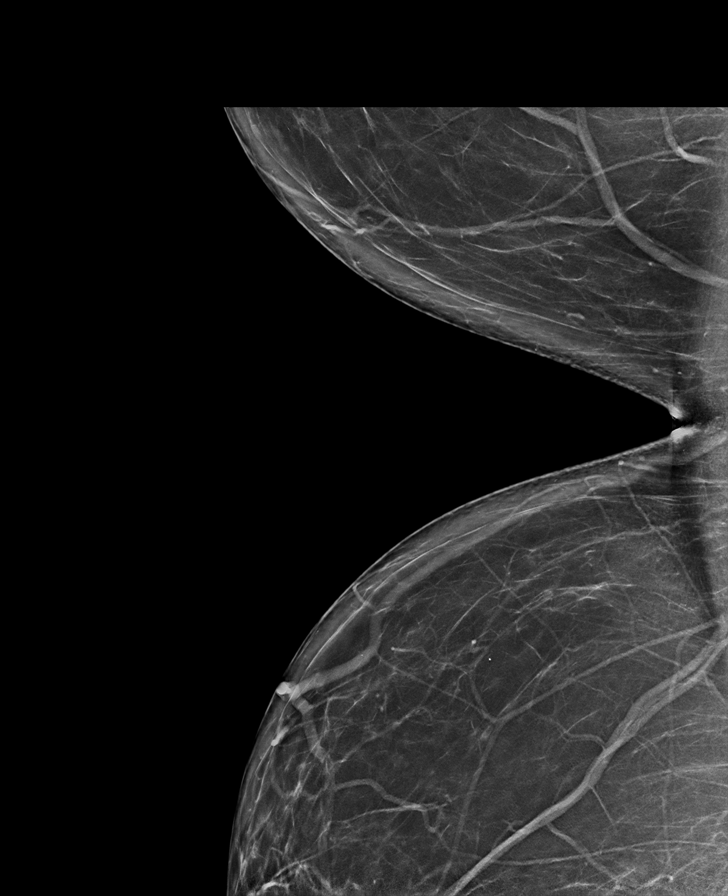

[L MLO synth-2D (2 of 2)]
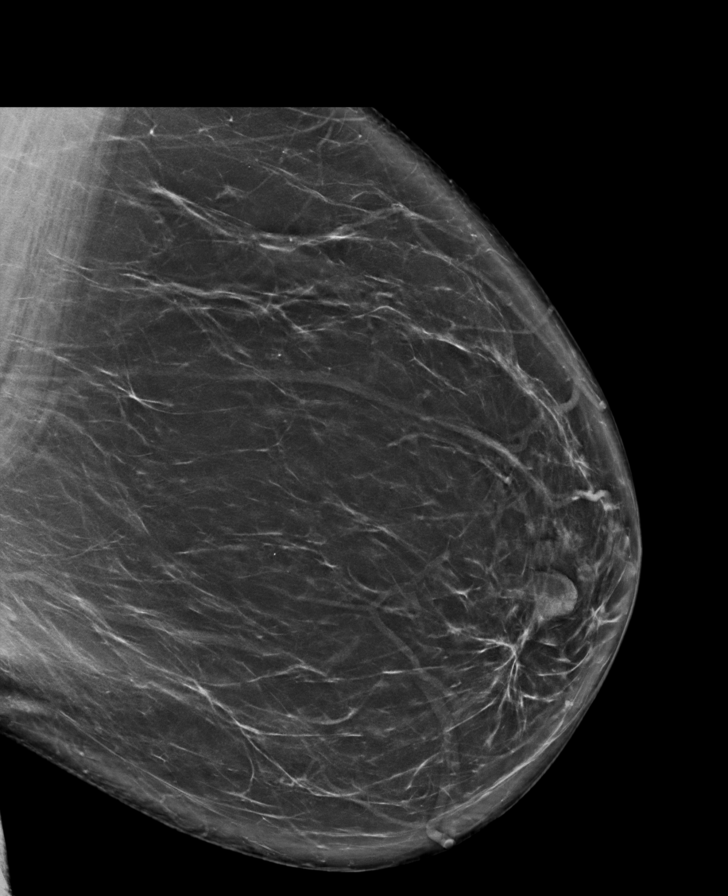

[R MLO tomo · tomo slice 51/100.0]
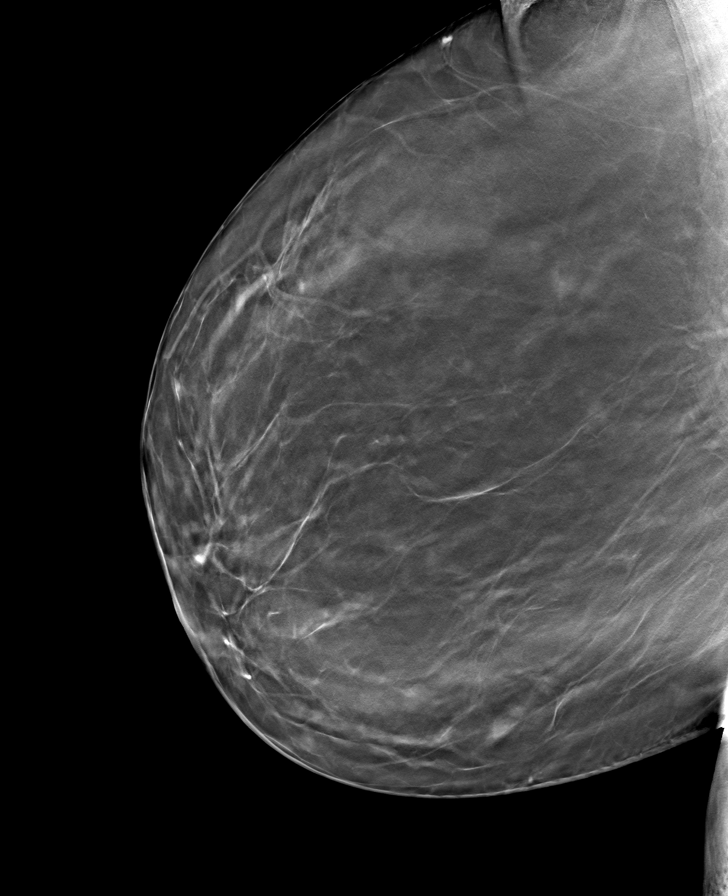

[8 of 40 positions shown; findings below may reference images not displayed]

ACR Breast Density Category b: There are scattered areas of
fibroglandular density.
FINDINGS: There are no findings suspicious for malignancy.
IMPRESSION: No mammographic evidence of malignancy. A result letter of this
screening mammogram will be mailed directly to the patient.

RECOMMENDATION:
Screening mammogram in one year. (Code:51-O-LD2)

BI-RADS CATEGORY  1: Negative.

## 2022-11-28 ENCOUNTER — Telehealth: Payer: Self-pay | Admitting: Gastroenterology

## 2022-11-28 NOTE — Telephone Encounter (Signed)
Inbound call from patient stating she has been having a cough since Friday 7/19. States she has not been running a fever. Requesting a call back to be advised on proceeding with endoscopy and colonoscopy scheduled for tomorrow 7/23. Please advise, thank you.

## 2022-11-28 NOTE — Telephone Encounter (Signed)
Returned pts call.  She states that she does have a cough that is productive but has no other symptoms.  She has not run a fever and is not experiencing any SOB.  She is a 2 day prep and states that she had nausea with the miralax and dulcolax.  She has zofran and I advised her to take that 30-45 mins prior.  She would like to proceed with procedure tomorrow.  She will call back if she starts to run a fever or if symptoms worsen.

## 2022-11-29 ENCOUNTER — Encounter: Payer: Self-pay | Admitting: Gastroenterology

## 2022-11-29 ENCOUNTER — Ambulatory Visit (AMBULATORY_SURGERY_CENTER): Payer: BC Managed Care – PPO | Admitting: Gastroenterology

## 2022-11-29 VITALS — BP 139/85 | HR 66 | Temp 98.0°F | Resp 9 | Ht 66.0 in | Wt 222.0 lb

## 2022-11-29 DIAGNOSIS — K279 Peptic ulcer, site unspecified, unspecified as acute or chronic, without hemorrhage or perforation: Secondary | ICD-10-CM

## 2022-11-29 DIAGNOSIS — D125 Benign neoplasm of sigmoid colon: Secondary | ICD-10-CM

## 2022-11-29 DIAGNOSIS — R140 Abdominal distension (gaseous): Secondary | ICD-10-CM

## 2022-11-29 DIAGNOSIS — D122 Benign neoplasm of ascending colon: Secondary | ICD-10-CM | POA: Diagnosis not present

## 2022-11-29 DIAGNOSIS — K5900 Constipation, unspecified: Secondary | ICD-10-CM

## 2022-11-29 DIAGNOSIS — K635 Polyp of colon: Secondary | ICD-10-CM | POA: Diagnosis not present

## 2022-11-29 DIAGNOSIS — D127 Benign neoplasm of rectosigmoid junction: Secondary | ICD-10-CM | POA: Diagnosis not present

## 2022-11-29 DIAGNOSIS — K219 Gastro-esophageal reflux disease without esophagitis: Secondary | ICD-10-CM

## 2022-11-29 DIAGNOSIS — R109 Unspecified abdominal pain: Secondary | ICD-10-CM

## 2022-11-29 DIAGNOSIS — K2950 Unspecified chronic gastritis without bleeding: Secondary | ICD-10-CM | POA: Diagnosis not present

## 2022-11-29 DIAGNOSIS — Z1211 Encounter for screening for malignant neoplasm of colon: Secondary | ICD-10-CM | POA: Diagnosis not present

## 2022-11-29 MED ORDER — SODIUM CHLORIDE 0.9 % IV SOLN
500.0000 mL | Freq: Once | INTRAVENOUS | Status: DC
Start: 2022-11-29 — End: 2022-11-29

## 2022-11-29 NOTE — Progress Notes (Signed)
GASTROENTEROLOGY PROCEDURE H&P NOTE   Primary Care Physician: [name], PA-C  HPI: Alison [name] is a 47 y.o. female who presents for EGD/Colonoscopy to evaluate for GERD and prior PUD, and alteration of bowel habits rule out IBD.  Past Medical History:  Diagnosis Date   Anxiety    Bacterial overgrowth syndrome 05/09/2010   Bloating 05/09/2010   SEP 2012 [facility] C/W SIBO(1ST pk 15, 2nd pk 21)   Ulcer 12/03/2010   Past Surgical History:  Procedure Laterality Date   CESAREAN SECTION  X2   CHOLECYSTECTOMY  APR 2012 [facility]   BILIARY DYSKINESIA, path: CHRONIC CHOLECYSTITIS   DILATATION AND CURETTAGE/HYSTEROSCOPY WITH MINERVA N/A 08/04/2021   Procedure: DILATATION AND CURETTAGE/HYSTEROSCOPY WITH MINERVA;  Surgeon: Lazaro Arms, MD;  Location: [facility];  Service: Gynecology;  Laterality: N/A;   ESOPHAGOGASTRODUODENOSCOPY  12/03/2010   Fields: PUD, Bx NEGATIVE for H pylori   ESOPHAGOGASTRODUODENOSCOPY  03/15/2011   Fields: mild gastritis, PUD resolved   HERNIA REPAIR     HYDROGEN BREATH TEST  01/12/2011   Positive SBBO   INCISIONAL HERNIA REPAIR  12/13/2010   Procedure: HERNIA REPAIR INCISIONAL;  Surgeon: Dalia Heading;  Location: [facility];  Service: General;  Laterality: N/A;  with mesh   INCISIONAL HERNIA REPAIR  01/24/2011   Procedure: HERNIA REPAIR INCISIONAL;  Surgeon: Dalia Heading;  Location: [facility];  Service: General;  Laterality: N/A;  Recurrent Incisional Herniorraphy with Mesh   TUBAL LIGATION     Current Outpatient Medications  Medication Sig Dispense Refill   citalopram (CELEXA) 10 MG tablet Take 20 mg by mouth daily.     hydrochlorothiazide (HYDRODIURIL) 12.5 MG tablet Take 12.5 mg by mouth daily.     norethindrone (MICRONOR) 0.35 MG tablet Take 1 tablet (0.35 mg total) by mouth daily. 90 tablet 4   omeprazole (PRILOSEC) 40 MG capsule Take 1 capsule (40 mg total) by mouth daily. 90 capsule 0   ondansetron (ZOFRAN-ODT) 8 MG disintegrating tablet Take 1 tablet (8 mg  total) by mouth every 8 (eight) hours as needed for nausea or vomiting. 8 tablet 0   Plecanatide (TRULANCE) 3 MG TABS Take 1 tablet (3 mg total) by mouth daily. 90 tablet 0   Probiotic Product (PROBIOTIC FORMULA PO) Take 2 tablets by mouth daily.     rosuvastatin (CRESTOR) 5 MG tablet Take 5 mg by mouth daily.     valsartan (DIOVAN) 40 MG tablet Take 40 mg by mouth daily.     ketorolac (TORADOL) 10 MG tablet Take 1 tablet (10 mg total) by mouth every 8 (eight) hours as needed. 15 tablet 0   ketorolac (TORADOL) 10 MG tablet Take 1 tablet (10 mg total) by mouth every 8 (eight) hours as needed. 15 tablet 0   levocetirizine (XYZAL) 5 MG tablet Take 5 mg by mouth at bedtime.     multivitamin (THERAGRAN) tablet Take 1 tablet by mouth daily.     WEGOVY 0.25 MG/0.5ML SOAJ SMARTSIG:0.25 Milligram(s) SUB-Q Once a Week     Current Facility-Administered Medications  Medication Dose Route Frequency Provider Last Rate Last Admin   0.9 %  sodium chloride infusion  500 mL Intravenous Once Mansouraty, Netty Starring., MD        Current Outpatient Medications:    citalopram (CELEXA) 10 MG tablet, Take 20 mg by mouth daily., Disp: , Rfl:    hydrochlorothiazide (HYDRODIURIL) 12.5 MG tablet, Take 12.5 mg by mouth daily., Disp: , Rfl:    norethindrone (MICRONOR) 0.35  MG tablet, Take 1 tablet (0.35 mg total) by mouth daily., Disp: 90 tablet, Rfl: 4   omeprazole (PRILOSEC) 40 MG capsule, Take 1 capsule (40 mg total) by mouth daily., Disp: 90 capsule, Rfl: 0   ondansetron (ZOFRAN-ODT) 8 MG disintegrating tablet, Take 1 tablet (8 mg total) by mouth every 8 (eight) hours as needed for nausea or vomiting., Disp: 8 tablet, Rfl: 0   Plecanatide (TRULANCE) 3 MG TABS, Take 1 tablet (3 mg total) by mouth daily., Disp: 90 tablet, Rfl: 0   Probiotic Product (PROBIOTIC FORMULA PO), Take 2 tablets by mouth daily., Disp: , Rfl:    rosuvastatin (CRESTOR) 5 MG tablet, Take 5 mg by mouth daily., Disp: , Rfl:    valsartan (DIOVAN) 40 MG  tablet, Take 40 mg by mouth daily., Disp: , Rfl:    ketorolac (TORADOL) 10 MG tablet, Take 1 tablet (10 mg total) by mouth every 8 (eight) hours as needed., Disp: 15 tablet, Rfl: 0   ketorolac (TORADOL) 10 MG tablet, Take 1 tablet (10 mg total) by mouth every 8 (eight) hours as needed., Disp: 15 tablet, Rfl: 0   levocetirizine (XYZAL) 5 MG tablet, Take 5 mg by mouth at bedtime., Disp: , Rfl:    multivitamin (THERAGRAN) tablet, Take 1 tablet by mouth daily., Disp: , Rfl:    WEGOVY 0.25 MG/0.5ML SOAJ, SMARTSIG:0.25 Milligram(s) SUB-Q Once a Week, Disp: , Rfl:   Current Facility-Administered Medications:    0.9 %  sodium chloride infusion, 500 mL, Intravenous, Once, Mansouraty, Netty Starring., MD Allergies  Allergen Reactions   Prednisone Nausea And Vomiting   Doxycycline Nausea And Vomiting   Family History  Problem Relation Age of Onset   Thalassemia Father    Diabetes Maternal Grandfather    Congenital adrenal hyperplasia Son    Anesthesia problems Neg Hx    Hypotension Neg Hx    Malignant hyperthermia Neg Hx    Pseudochol deficiency Neg Hx    Colon cancer Neg Hx    Esophageal cancer Neg Hx    Rectal cancer Neg Hx    Stomach cancer Neg Hx    Social History   Socioeconomic History   Marital status: Married    Spouse name: Not on file   Number of children: Not on file   Years of education: Not on file   Highest education level: Not on file  Occupational History   Not on file  Tobacco Use   Smoking status: Former    Current packs/day: 0.50    Average packs/day: 0.5 packs/day for 10.0 years (5.0 ttl pk-yrs)    Types: Cigarettes   Smokeless tobacco: Never   Tobacco comments:    Quit 04/2022, not currently smoking  Vaping Use   Vaping status: Never Used  Substance and Sexual Activity   Alcohol use: No   Drug use: No   Sexual activity: Yes    Birth control/protection: Surgical    Comment: tubal  Other Topics Concern   Not on file  Social History Narrative   Not on file    Social Determinants of Health   Financial Resource Strain: Low Risk  (07/08/2021)   Overall Financial Resource Strain (CARDIA)    Difficulty of Paying Living Expenses: Not very hard  Food Insecurity: No Food Insecurity (07/08/2021)   Hunger Vital Sign    Worried About Running Out of Food in the Last Year: Never true    Ran Out of Food in the Last Year: Never true  Transportation Needs: No  Transportation Needs (07/08/2021)   PRAPARE - Administrator, Civil Service (Medical): No    Lack of Transportation (Non-Medical): No  Physical Activity: Insufficiently Active (07/08/2021)   Exercise Vital Sign    Days of Exercise per Week: 1 day    Minutes of Exercise per Session: 10 min  Stress: No Stress Concern Present (07/08/2021)   [employer] of Occupational Health - Occupational Stress Questionnaire    Feeling of Stress : Only a little  Social Connections: Moderately Integrated (07/08/2021)   Social Connection and Isolation Panel [NHANES]    Frequency of Communication with Friends and Family: Three times a week    Frequency of Social Gatherings with Friends and Family: Once a week    Attends Religious Services: 1 to 4 times per year    Active Member of [employer] or Organizations: No    Attends Banker Meetings: Never    Marital Status: Married  Catering manager Violence: Not At Risk (07/08/2021)   Humiliation, Afraid, Rape, and Kick questionnaire    Fear of Current or Ex-Partner: No    Emotionally Abused: No    Physically Abused: No    Sexually Abused: No    Physical Exam: Today's Vitals   11/29/22 1416 11/29/22 1420  BP: 126/77   Pulse: 74   Temp: 98 F (36.7 C) 98 F (36.7 C)  TempSrc: Core (Comment)   SpO2: 99%   Weight: 222 lb (100.7 kg)   Height: 5\' 6"  (1.676 m)    Body mass index is 35.83 kg/m. GEN: NAD EYE: Sclerae anicteric ENT: MMM CV: Non-tachycardic GI: Soft, NT/ND NEURO:  Alert & Oriented x 3  Lab Results: No results for input(s): "WBC",  "HGB", "HCT", "PLT" in the last 72 hours. BMET No results for input(s): "NA", "K", "CL", "CO2", "GLUCOSE", "BUN", "CREATININE", "CALCIUM" in the last 72 hours. LFT No results for input(s): "PROT", "ALBUMIN", "AST", "ALT", "ALKPHOS", "BILITOT", "BILIDIR", "IBILI" in the last 72 hours. PT/INR No results for input(s): "LABPROT", "INR" in the last 72 hours.   Impression / Plan: This is a 47 y.o.female who presents for EGD/Colonoscopy to evaluate for GERD and prior PUD, and alteration of bowel habits rule out IBD.  The risks and benefits of endoscopic evaluation/treatment were discussed with the patient and/or family; these include but are not limited to the risk of perforation, infection, bleeding, missed lesions, lack of diagnosis, severe illness requiring hospitalization, as well as anesthesia and sedation related illnesses.  The patient's history has been reviewed, patient examined, no change in status, and deemed stable for procedure.  The patient and/or family is agreeable to proceed.    Corliss Parish, MD [facility] Office # [phone]

## 2022-11-29 NOTE — Op Note (Addendum)
[facility] Patient Name: Alison [name] Procedure Date: 11/29/2022 3:18 PM MRN: [id number] Endoscopist: Corliss Parish , MD, [id number] Age: 47 Referring MD:  Date of Birth: 04-03-1976 Gender: Female Account #: [id number] Procedure:                Colonoscopy Indications:              Screening for colorectal malignant neoplasm,                            Incidental constipation noted Medicines:                Monitored Anesthesia Care Procedure:                Pre-Anesthesia Assessment:                           - Prior to the procedure, a History and Physical                            was performed, and patient medications and                            allergies were reviewed. The patient's tolerance of                            previous anesthesia was also reviewed. The risks                            and benefits of the procedure and the sedation                            options and risks were discussed with the patient.                            All questions were answered, and informed consent                            was obtained. Prior Anticoagulants: The patient has                            taken no anticoagulant or antiplatelet agents. ASA                            Grade Assessment: II - A patient with mild systemic                            disease. After reviewing the risks and benefits,                            the patient was deemed in satisfactory condition to                            undergo the procedure.  After obtaining informed consent, the colonoscope                            was passed under direct vision. Throughout the                            procedure, the patient's blood pressure, pulse, and                            oxygen saturations were monitored continuously. The                            CF [id number] #[id number] was introduced through the anus                            and advanced to the 3 cm  into the ileum. The                            colonoscopy was performed without difficulty. The                            patient tolerated the procedure. The quality of the                            bowel preparation was adequate. The terminal ileum,                            ileocecal valve, appendiceal orifice, and rectum                            were photographed. Scope In: 3:34:05 PM Scope Out: 3:51:08 PM Scope Withdrawal Time: 0 hours 12 minutes 19 seconds  Total Procedure Duration: 0 hours 17 minutes 3 seconds  Findings:                 Skin tags were found on perianal exam.                           The digital rectal exam findings include                            hemorrhoids. Pertinent negatives include no                            palpable rectal lesions.                           Three sessile polyps were found in the                            recto-sigmoid colon, sigmoid colon and ascending                            colon. The polyps were 3 to 7 mm in size. These  polyps were removed with a cold snare. Resection                            and retrieval were complete.                           Normal mucosa was found in the entire colon                            otherwise.                           Non-bleeding non-thrombosed external and internal                            hemorrhoids were found during retroflexion, during                            perianal exam and during digital exam. The                            hemorrhoids were Grade II (internal hemorrhoids                            that prolapse but reduce spontaneously). Complications:            No immediate complications. Estimated Blood Loss:     Estimated blood loss was minimal. Impression:               - Perianal skin tags found on perianal exam.                           - Hemorrhoids found on digital rectal exam.                           - Three 3 to 7 mm polyps at  the recto-sigmoid                            colon, in the sigmoid colon and in the ascending                            colon, removed with a cold snare. Resected and                            retrieved.                           - Normal mucosa in the entire examined colon                            otherwise.                           - Non-bleeding non-thrombosed external and internal  hemorrhoids. Recommendation:           - The patient will be observed post-procedure,                            until all discharge criteria are met.                           - Discharge patient to home.                           - Patient has a contact number available for                            emergencies. The signs and symptoms of potential                            delayed complications were discussed with the                            patient. Return to normal activities tomorrow.                            Written discharge instructions were provided to the                            patient.                           - High fiber diet.                           - Use FiberCon 1-2 tablets PO daily.                           - Continue present medications.                           - Follow-up pathology.                           - Repeat colonoscopy based on pathology.                           - The findings and recommendations were discussed                            with the patient.                           - The findings and recommendations were discussed                            with the patient's family. Corliss Parish, MD 11/29/2022 4:04:48 PM

## 2022-11-29 NOTE — Progress Notes (Signed)
Called to room to assist during endoscopic procedure.  Patient ID and intended procedure confirmed with present staff. Received instructions for my participation in the procedure from the performing physician.  

## 2022-11-29 NOTE — Progress Notes (Signed)
Uneventful anesthetic. Report to pacu rn. Vss. Care resumed by rn. 

## 2022-11-29 NOTE — Patient Instructions (Addendum)
Await pathology results. High fiber diet. Use FiberCon 1-2 tablets by mouth daily. Continue present medications.  Handout on polyps, hiatal hernia, and hemorrhoids provided.  YOU HAD AN ENDOSCOPIC PROCEDURE TODAY AT THE [facility]:   Refer to the procedure report that was given to you for any specific questions about what was found during the examination.  If the procedure report does not answer your questions, please call your gastroenterologist to clarify.  If you requested that your care partner not be given the details of your procedure findings, then the procedure report has been included in a sealed envelope for you to review at your convenience later.  YOU SHOULD EXPECT: Some feelings of bloating in the abdomen. Passage of more gas than usual.  Walking can help get rid of the air that was put into your GI tract during the procedure and reduce the bloating. If you had a lower endoscopy (such as a colonoscopy or flexible sigmoidoscopy) you may notice spotting of blood in your stool or on the toilet paper. If you underwent a bowel prep for your procedure, you may not have a normal bowel movement for a few days.  Please Note:  You might notice some irritation and congestion in your nose or some drainage.  This is from the oxygen used during your procedure.  There is no need for concern and it should clear up in a day or so.  SYMPTOMS TO REPORT IMMEDIATELY:  Following lower endoscopy (colonoscopy or flexible sigmoidoscopy):  Excessive amounts of blood in the stool  Significant tenderness or worsening of abdominal pains  Swelling of the abdomen that is new, acute  Fever of 100F or higher  Following upper endoscopy (EGD)  Vomiting of blood or coffee ground material  New chest pain or pain under the shoulder blades  Painful or persistently difficult swallowing  New shortness of breath  Fever of 100F or higher  Black, tarry-looking stools  For urgent or emergent issues, a  gastroenterologist can be reached at any hour by calling [phone]. Do not use MyChart messaging for urgent concerns.    DIET:  We do recommend a small meal at first, but then you may proceed to your regular diet.  Drink plenty of fluids but you should avoid alcoholic beverages for 24 hours.  ACTIVITY:  You should plan to take it easy for the rest of today and you should NOT DRIVE or use heavy machinery until tomorrow (because of the sedation medicines used during the test).    FOLLOW UP: Our staff will call the number listed on your records the next business day following your procedure.  We will call around 7:15- 8:00 am to check on you and address any questions or concerns that you may have regarding the information given to you following your procedure. If we do not reach you, we will leave a message.     If any biopsies were taken you will be contacted by phone or by letter within the next 1-3 weeks.  Please call us at [phone] if you have not heard about the biopsies in 3 weeks.    SIGNATURES/CONFIDENTIALITY: You and/or your care partner have signed paperwork which will be entered into your electronic medical record.  These signatures attest to the fact that that the information above on your After Visit Summary has been reviewed and is understood.  Full responsibility of the confidentiality of this discharge information lies with you and/or your care-partner.

## 2022-11-29 NOTE — Op Note (Signed)
[facility] Patient Name: Alison [name] Procedure Date: 11/29/2022 3:19 PM MRN: [id number] Endoscopist: Corliss Parish , MD, [id number] Age: 47 Referring MD:  Date of Birth: [birth year] Gender: Female Account #: [id number] Procedure:                Upper GI endoscopy Indications:              Heartburn, Suspected gastro-esophageal reflux                            disease, Exclusion of peptic ulcer Medicines:                Monitored Anesthesia Care Procedure:                Pre-Anesthesia Assessment:                           - Prior to the procedure, a History and Physical                            was performed, and patient medications and                            allergies were reviewed. The patient's tolerance of                            previous anesthesia was also reviewed. The risks                            and benefits of the procedure and the sedation                            options and risks were discussed with the patient.                            All questions were answered, and informed consent                            was obtained. Prior Anticoagulants: The patient has                            taken no anticoagulant or antiplatelet agents                            except for NSAID medication. ASA Grade Assessment:                            II - A patient with mild systemic disease. After                            reviewing the risks and benefits, the patient was                            deemed in satisfactory condition to undergo the  procedure.                           After obtaining informed consent, the endoscope was                            passed under direct vision. Throughout the                            procedure, the patient's blood pressure, pulse, and                            oxygen saturations were monitored continuously. The                            Olympus Scope [id number] was  introduced through the                            mouth, and advanced to the second part of duodenum.                            The upper GI endoscopy was accomplished without                            difficulty. The patient tolerated the procedure. Scope In: Scope Out: Findings:                 No gross lesions were noted in the entire esophagus.                           The Z-line was regular and was found 34 cm from the                            incisors.                           A 3 cm hiatal hernia was present.                           Patchy mildly erythematous mucosa without bleeding                            was found in the entire examined stomach. Biopsies                            were taken with a cold forceps for histology and                            Helicobacter pylori testing.                           No gross lesions were noted in the duodenal bulb,                            in the first portion of  the duodenum and in the                            second portion of the duodenum. Biopsies were taken                            with a cold forceps for histology. Complications:            No immediate complications. Estimated Blood Loss:     Estimated blood loss was minimal. Impression:               - No gross lesions in the entire esophagus.                           - Z-line regular, 34 cm from the incisors.                           - 3 cm hiatal hernia.                           - Erythematous mucosa in the stomach. Biopsied.                           - No gross lesions in the duodenal bulb, in the                            first portion of the duodenum and in the second                            portion of the duodenum. Biopsied. Recommendation:           - Proceed to scheduled colonoscopy.                           - Observe patient's clinical course.                           - Continue present medications otherwise.                           -  Follow up pathology.                           - The findings and recommendations were discussed                            with the patient.                           - The findings and recommendations were discussed                            with the patient's family. Corliss Parish, MD 11/29/2022 4:01:21 PM

## 2022-11-30 ENCOUNTER — Telehealth: Payer: Self-pay

## 2022-11-30 NOTE — Telephone Encounter (Signed)
  Follow up Call-     11/29/2022    2:20 PM  Call back number  Post procedure Call Back phone  # [phone]  Permission to leave phone message Yes     Patient questions:  Do you have a fever, pain , or abdominal swelling? No. Pain Score  0 *  Have you tolerated food without any problems? Yes.    Have you been able to return to your normal activities? Yes.    Do you have any questions about your discharge instructions: Diet   No. Medications  No. Follow up visit  No.  Do you have questions or concerns about your Care? No.  Actions: * If pain score is 4 or above: No action needed, pain <4.

## 2022-12-05 ENCOUNTER — Encounter: Payer: Self-pay | Admitting: Gastroenterology

## 2023-02-22 ENCOUNTER — Other Ambulatory Visit: Payer: Self-pay | Admitting: Physician Assistant

## 2023-03-15 DIAGNOSIS — R109 Unspecified abdominal pain: Secondary | ICD-10-CM

## 2023-03-15 DIAGNOSIS — K580 Irritable bowel syndrome with diarrhea: Secondary | ICD-10-CM

## 2023-03-15 MED ORDER — RIFAXIMIN 550 MG PO TABS
550.0000 mg | ORAL_TABLET | Freq: Three times a day (TID) | ORAL | 0 refills | Status: AC
Start: 2023-03-15 — End: 2023-03-29

## 2023-05-30 ENCOUNTER — Other Ambulatory Visit: Payer: Self-pay

## 2023-05-30 MED ORDER — TRULANCE 3 MG PO TABS: 1.0000 | ORAL_TABLET | Freq: Every day | ORAL | 3 refills | Status: AC

## 2023-06-09 NOTE — Progress Notes (Signed)
Surgical Instructions   Your procedure is scheduled on Thursday, February 6th, 2025. Report to [facility] Main Entrance "A" at 5:30 A.M., then check in with the Admitting office. Any questions or running late day of surgery: call [phone]  Questions prior to your surgery date: call [phone], Monday-Friday, 8am-4pm. If you experience any cold or flu symptoms such as cough, fever, chills, shortness of breath, etc. between now and your scheduled surgery, please notify us at the above number.     Remember:  Do not eat after midnight the night before your surgery   You may drink clear liquids until 4:30 the morning of your surgery.   Clear liquids allowed are: Water, Non-Citrus Juices (without pulp), Carbonated Beverages, Clear Tea (no milk, honey, etc.), Black Coffee Only (NO MILK, CREAM OR POWDERED CREAMER of any kind), and Gatorade.    Take these medicines the morning of surgery with A SIP OF WATER: Fexofenadine (Allegra) Omeprazole (Prilosec) Paroxetine (Paxil) Rosuvastatin (Crestor)   May take these medicines IF NEEDED: Tramadol (Ultram)   One week prior to surgery, STOP taking any Aspirin (unless otherwise instructed by your surgeon) Aleve, Naproxen, Ibuprofen, Motrin, Advil, Goody's, BC's, all herbal medications, fish oil, and non-prescription vitamins.                     Do NOT Smoke (Tobacco/Vaping) for 24 hours prior to your procedure.  If you use a CPAP at night, you may bring your mask/headgear for your overnight stay.   You will be asked to remove any contacts, glasses, piercing's, hearing aid's, dentures/partials prior to surgery. Please bring cases for these items if needed.    Patients discharged the day of surgery will not be allowed to drive home, and someone needs to stay with them for 24 hours.  SURGICAL WAITING ROOM VISITATION Patients may have no more than 2 support people in the waiting area - these visitors may rotate.   Pre-op nurse will  coordinate an appropriate time for 1 ADULT support person, who may not rotate, to accompany patient in pre-op.  Children under the age of 58 must have an adult with them who is not the patient and must remain in the main waiting area with an adult.  If the patient needs to stay at the hospital during part of their recovery, the visitor guidelines for inpatient rooms apply.  Please refer to the [facility] website for the visitor guidelines for any additional information.   If you received a COVID test during your pre-op visit  it is requested that you wear a mask when out in public, stay away from anyone that may not be feeling well and notify your surgeon if you develop symptoms. If you have been in contact with anyone that has tested positive in the last 10 days please notify you surgeon.      Pre-operative CHG Bathing Instructions   You can play a key role in reducing the risk of infection after surgery. Your skin needs to be as free of germs as possible. You can reduce the number of germs on your skin by washing with CHG (chlorhexidine gluconate) soap before surgery. CHG is an antiseptic soap that kills germs and continues to kill germs even after washing.   DO NOT use if you have an allergy to chlorhexidine/CHG or antibacterial soaps. If your skin becomes reddened or irritated, stop using the CHG and notify one of our RNs at [phone].  TAKE A SHOWER THE NIGHT BEFORE SURGERY AND THE DAY OF SURGERY    Please keep in mind the following:  DO NOT shave, including legs and underarms, 48 hours prior to surgery.   You may shave your face before/day of surgery.  Place clean sheets on your bed the night before surgery Use a clean washcloth (not used since being washed) for each shower. DO NOT sleep with pet's night before surgery.  CHG Shower Instructions:  Wash your face and private area with normal soap. If you choose to wash your hair, wash first with your normal shampoo.   After you use shampoo/soap, rinse your hair and body thoroughly to remove shampoo/soap residue.  Turn the water OFF and apply half the bottle of CHG soap to a CLEAN washcloth.  Apply CHG soap ONLY FROM YOUR NECK DOWN TO YOUR TOES (washing for 3-5 minutes)  DO NOT use CHG soap on face, private areas, open wounds, or sores.  Pay special attention to the area where your surgery is being performed.  If you are having back surgery, having someone wash your back for you may be helpful. Wait 2 minutes after CHG soap is applied, then you may rinse off the CHG soap.  Pat dry with a clean towel  Put on clean pajamas    Additional instructions for the day of surgery: DO NOT APPLY any lotions, deodorants, cologne, or perfumes.   Do not wear jewelry or makeup Do not wear nail polish, gel polish, artificial nails, or any other type of covering on natural nails (fingers and toes) Do not bring valuables to the hospital. [facility] is not responsible for valuables/personal belongings. Put on clean/comfortable clothes.  Please brush your teeth.  Ask your nurse before applying any prescription medications to the skin.

## 2023-06-12 ENCOUNTER — Encounter (HOSPITAL_COMMUNITY)
Admission: RE | Admit: 2023-06-12 | Discharge: 2023-06-12 | Disposition: A | Discharge status: Home / Self Care | Payer: BC Managed Care – PPO | Source: Ambulatory Visit | Attending: Obstetrics and Gynecology | Admitting: Obstetrics and Gynecology

## 2023-06-12 ENCOUNTER — Encounter (HOSPITAL_COMMUNITY): Payer: Self-pay

## 2023-06-12 DIAGNOSIS — E669 Obesity, unspecified: Secondary | ICD-10-CM | POA: Diagnosis not present

## 2023-06-12 DIAGNOSIS — Z87891 Personal history of nicotine dependence: Secondary | ICD-10-CM | POA: Insufficient documentation

## 2023-06-12 DIAGNOSIS — Z01812 Encounter for preprocedural laboratory examination: Secondary | ICD-10-CM | POA: Insufficient documentation

## 2023-06-12 DIAGNOSIS — F419 Anxiety disorder, unspecified: Secondary | ICD-10-CM | POA: Insufficient documentation

## 2023-06-12 DIAGNOSIS — I2510 Atherosclerotic heart disease of native coronary artery without angina pectoris: Secondary | ICD-10-CM | POA: Insufficient documentation

## 2023-06-12 DIAGNOSIS — Z6839 Body mass index (BMI) 39.0-39.9, adult: Secondary | ICD-10-CM | POA: Insufficient documentation

## 2023-06-12 DIAGNOSIS — I10 Essential (primary) hypertension: Secondary | ICD-10-CM | POA: Insufficient documentation

## 2023-06-12 DIAGNOSIS — Z01818 Encounter for other preprocedural examination: Secondary | ICD-10-CM

## 2023-06-12 DIAGNOSIS — K219 Gastro-esophageal reflux disease without esophagitis: Secondary | ICD-10-CM | POA: Insufficient documentation

## 2023-06-12 DIAGNOSIS — Z9049 Acquired absence of other specified parts of digestive tract: Secondary | ICD-10-CM | POA: Insufficient documentation

## 2023-06-12 DIAGNOSIS — R102 Pelvic and perineal pain: Secondary | ICD-10-CM | POA: Diagnosis not present

## 2023-06-12 HISTORY — DX: Essential (primary) hypertension: I10

## 2023-06-12 HISTORY — DX: Other specified postprocedural states: Z98.890

## 2023-06-12 HISTORY — DX: Gastro-esophageal reflux disease without esophagitis: K21.9

## 2023-06-12 HISTORY — DX: Other specified postprocedural states: R11.2

## 2023-06-12 HISTORY — DX: Pneumonia, unspecified organism: J18.9

## 2023-06-12 LAB — HIV ANTIBODY (ROUTINE TESTING W REFLEX): HIV Screen 4th Generation wRfx: NONREACTIVE

## 2023-06-12 LAB — CBC
HCT: 39.2 % (ref 36.0–46.0)
Hemoglobin: 13.7 g/dL (ref 12.0–15.0)
MCH: 31.7 pg (ref 26.0–34.0)
MCHC: 34.9 g/dL (ref 30.0–36.0)
MCV: 90.7 fL (ref 80.0–100.0)
Platelets: 294 10*3/uL (ref 150–400)
RBC: 4.32 MIL/uL (ref 3.87–5.11)
RDW: 12.8 % (ref 11.5–15.5)
WBC: 5.4 10*3/uL (ref 4.0–10.5)
nRBC: 0 % (ref 0.0–0.2)

## 2023-06-12 LAB — BASIC METABOLIC PANEL
Anion gap: 10 (ref 5–15)
BUN: <5 mg/dL — ABNORMAL LOW (ref 6–20)
CO2: 25 mmol/L (ref 22–32)
Calcium: 9.5 mg/dL (ref 8.9–10.3)
Chloride: 106 mmol/L (ref 98–111)
Creatinine, Ser: 0.82 mg/dL (ref 0.44–1.00)
GFR, Estimated: >60 mL/min (ref >60)
Glucose, Bld: 91 mg/dL (ref 70–99)
Potassium: 3.7 mmol/L (ref 3.5–5.1)
Sodium: 141 mmol/L (ref 135–145)

## 2023-06-12 LAB — TYPE AND SCREEN
ABO/RH(D): O POS
Antibody Screen: NEGATIVE

## 2023-06-12 LAB — RPR: RPR Ser Ql: NONREACTIVE

## 2023-06-12 NOTE — Progress Notes (Signed)
PCP - [name], PA-C Cardiologist - Denies  PPM/ICD - Denies Device Orders - n/a Rep Notified - n/a  Chest x-ray - n/a EKG - Per pt, within the past year at PCP. Tracing requested Stress Test - 11/07/2022 - [facility] ECHO - 11/04/2022 - [facility] Cardiac Cath - Denies  Sleep Study - Denies CPAP - n/a  No DM  Last dose of GLP1 agonist- n/a GLP1 instructions: n/a  Blood Thinner Instructions: n/a Aspirin Instructions: n/a  ERAS Protcol - Clear liquids until 0430 morning of surgery PRE-SURGERY Ensure or G2- n/a  COVID TEST- n/a   Anesthesia review: Yes. EKG tracing requested. Cardiac testing completed in 2024 was normal and symptoms determined to be caused by anxiety.  Patient denies shortness of breath, fever, cough and chest pain at PAT appointment. Pt denies any respiratory illness/infection in the last two months.   All instructions explained to the patient, with a verbal understanding of the material. Patient agrees to go over the instructions while at home for a better understanding. Patient also instructed to self quarantine after being tested for COVID-19. The opportunity to ask questions was provided.

## 2023-06-13 NOTE — Progress Notes (Signed)
Anesthesia Chart Review:  Case: [id number] Date/Time: 06/15/23 0715   Procedure: HYSTERECTOMY ABDOMINAL WITH SALPINGECTOMY (Bilateral)   Anesthesia type: General   Pre-op diagnosis: PELVIC PAIN   [facility]: [facility] ROOM 08 / [facility] OR   Surgeons: Alison Henri, MD       DISCUSSION: Alison [name] is a 48 year old female scheduled for Alison above procedure.  History includes former smoker, postoperative N/V, HTN, GERD, cholecystectomy (08/2010), incisional hernia repair (05/25/11). BMI is consistent with obesity.  She had EKG, stress and echo within Alison past year for evaluation of chest pain. Echo on 11/04/22 showed LVEF 65-70%, normal LV/RV systolic function, trivial MR/TR. 11/07/22 ETT was low risk. She reported symptoms attributed to anxiety. EKG requested from [facility].  Anesthesia team to evaluate on Alison day of surgery. She is for ABO and Urine pregnancy test on Alison day of surgery.   VS: BP (P) 126/88   Pulse (P) 78   Temp (P) 36.4 C   Resp (P) 20   Ht (P) 5\' 5"  (1.651 m)   Wt (P) 108.5 kg   SpO2 (P) 100%   BMI (P) 39.82 kg/m   PROVIDERS: Alison Moris, PA-C is PCP ([facility])   LABS: Labs reviewed: Acceptable for surgery. (all labs ordered are listed, but only abnormal results are displayed)  Labs Reviewed  BASIC METABOLIC PANEL - Abnormal; Notable for Alison following components:      Result Value   BUN <5 (*)    All other components within normal limits  HIV ANTIBODY (ROUTINE TESTING W REFLEX)  CBC  RPR  TYPE AND SCREEN    EKG: Requested from [facility].    CV: ETT 11/07/22 [facility]): Alison [name]'s exercise capacity was good.  Alison [name] reported dyspnea during exercise and no chest pain or pressure.  No significant ST segment changes or arrhythmias were noted during stress  or recovery.  Low risk ETT for ischemia based on ECG criteria at adequate workload.   Echo 11/04/22 [facility]): Summary  1. Alison left ventricle is normal in size with normal wall  thickness.  2. Alison left ventricular systolic function is normal, LVEF is visually  estimated at 65-70%.  3. Alison right ventricle is normal in size, with normal systolic function.  4. Trivial mitral valve and tricuspid valve regurgitation.   Past Medical History:  Diagnosis Date   Anxiety    Bacterial overgrowth syndrome 05/09/2010   Bloating 05/09/2010   SEP 2012 HBT C/W SIBO(1ST pk 15, 2nd pk 21)   GERD (gastroesophageal reflux disease)    Hypertension    Pneumonia    PONV (postoperative nausea and vomiting)    Ulcer 12/03/2010    Past Surgical History:  Procedure Laterality Date   CESAREAN SECTION  X2   CHOLECYSTECTOMY  APR 2012 [facility]   BILIARY DYSKINESIA, path: CHRONIC CHOLECYSTITIS   DILATATION AND CURETTAGE/HYSTEROSCOPY WITH MINERVA N/A 08/04/2021   Procedure: DILATATION AND CURETTAGE/HYSTEROSCOPY WITH MINERVA;  Surgeon: Lazaro Arms, MD;  Location: [facility]S;  Service: Gynecology;  Laterality: N/A;   ESOPHAGOGASTRODUODENOSCOPY  12/03/2010   Fields: PUD, Bx NEGATIVE for H pylori   ESOPHAGOGASTRODUODENOSCOPY  03/15/2011   Fields: mild gastritis, PUD resolved   HYDROGEN BREATH TEST  01/12/2011   Positive SBBO   INCISIONAL HERNIA REPAIR  12/13/2010   Procedure: HERNIA REPAIR INCISIONAL;  Surgeon: Alison Alison [name];  Location: [facility] ORS;  Service: General;  Laterality: N/A;  with mesh   INCISIONAL HERNIA REPAIR  01/24/2011   Procedure: HERNIA  REPAIR INCISIONAL;  Surgeon: Alison Alison [name];  Location: [facility]S;  Service: General;  Laterality: N/A;  Recurrent Incisional Herniorraphy with Mesh   INCISIONAL HERNIA REPAIR  2013   [facility]   TUBAL LIGATION      MEDICATIONS:  Cholecalciferol (VITAMIN D3 PO)   clonazePAM (KLONOPIN) 0.5 MG tablet   fexofenadine (ALLEGRA) 180 MG tablet   hydrochlorothiazide (HYDRODIURIL) 12.5 MG tablet   ketorolac (TORADOL) 10 MG tablet   ketorolac (TORADOL) 10 MG tablet   norethindrone (MICRONOR) 0.35 MG tablet   Omega-3 Fatty Acids (FISH OIL PO)    omeprazole (PRILOSEC) 40 MG capsule   ondansetron (ZOFRAN-ODT) 8 MG disintegrating tablet   PARoxetine (PAXIL) 40 MG tablet   Plecanatide (TRULANCE) 3 MG TABS   rosuvastatin (CRESTOR) 5 MG tablet   traMADol (ULTRAM) 50 MG tablet   valsartan (DIOVAN) 40 MG tablet   No current facility-administered medications for this encounter.    Alison Chock, PA-C Surgical Short Stay/Anesthesiology [facility] Phone [phone] [facility] Phone [phone] 06/13/2023 2:23 PM

## 2023-06-13 NOTE — Anesthesia Preprocedure Evaluation (Addendum)
 Anesthesia Evaluation  Patient identified by MRN, date of birth, ID band Patient awake    Reviewed: Allergy & Precautions, H&P , NPO status , Patient's Chart, lab work & pertinent test results  History of Anesthesia Complications (+) PONV and history of anesthetic complications  Airway Mallampati: II  TM Distance: >3 FB Neck ROM: Full    Dental no notable dental hx. (+) Dental Advisory Given   Pulmonary Patient abstained from smoking., former smoker   Pulmonary exam normal breath sounds clear to auscultation       Cardiovascular hypertension, Normal cardiovascular exam Rhythm:Regular Rate:Normal     Neuro/Psych  PSYCHIATRIC DISORDERS Anxiety     negative neurological ROS     GI/Hepatic Neg liver ROS, PUD,GERD  ,,  Endo/Other  negative endocrine ROS    Renal/GU negative Renal ROS  negative genitourinary   Musculoskeletal negative musculoskeletal ROS (+)    Abdominal   Peds negative pediatric ROS (+)  Hematology negative hematology ROS (+)   Anesthesia Other Findings   Reproductive/Obstetrics negative OB ROS                             Anesthesia Physical Anesthesia Plan  ASA: 2  Anesthesia Plan: General   Post-op Pain Management:    Induction: Intravenous  PONV Risk Score and Plan: 4 or greater and Ondansetron , Dexamethasone , Midazolam , Scopolamine  patch - Pre-op and Treatment may vary due to age or medical condition  Airway Management Planned: Oral ETT  Additional Equipment:   Intra-op Plan:   Post-operative Plan: Extubation in OR  Informed Consent: I have reviewed the patients History and Physical, chart, labs and discussed the procedure including the risks, benefits and alternatives for the proposed anesthesia with the patient or authorized representative who has indicated his/her understanding and acceptance.     Dental advisory given  Plan Discussed with:  CRNA  Anesthesia Plan Comments: (PAT note written 06/13/2023 by Isaiah Ruder, PA-C.  48 year old female scheduled for the above procedure.   History includes former smoker, postoperative N/V, HTN, GERD, cholecystectomy (08/2010), incisional hernia repair (05/25/11). BMI is consistent with obesity.   She had EKG, stress and echo within the past year for evaluation of chest pain. Echo on 11/04/22 showed LVEF 65-70%, normal LV/RV systolic function, trivial MR/TR. 11/07/22 ETT was low risk. She reported symptoms attributed to anxiety. EKG requested from [facility]. )       Anesthesia Quick Evaluation

## 2023-06-14 NOTE — H&P (Signed)
 Gynecology History and Physical Pre-Procedure H&P for scheduled TAHBS  [name] is a 48 y.o. female G2P2 presenting for scheduled TAH BS for pelvic pain, post-ablation pain.   She underwent endometrial ablation about 2 years ago and has had persistent pelvic pain since. She has not had adequate improvement with attempts at cervical dilation or progestin therapy. Her pain has affected her quality of life, and patient elects for hysterectomy.   She has a history of C section as well as multiple abdominal hernia repairs, including with mesh. She has not had incisional issues with her Pfannenstiel incision.  PMHx includes, anxiety, HLD, HTN.  OB History     Gravida  2   Para  2   Term  0   Preterm  2   AB      Living  2      SAB      IAB      Ectopic      Multiple      Live Births             Past Medical History:  Diagnosis Date   Anxiety    Bacterial overgrowth syndrome 05/09/2010   Bloating 05/09/2010   SEP 2012 HBT C/W SIBO(1ST pk 15, 2nd pk 21)   GERD (gastroesophageal reflux disease)    Hypertension    Pneumonia    PONV (postoperative nausea and vomiting)    Ulcer 12/03/2010   Past Surgical History:  Procedure Laterality Date   CESAREAN SECTION  X2   CHOLECYSTECTOMY  APR 2012 [facility]   BILIARY DYSKINESIA, path: CHRONIC CHOLECYSTITIS   DILATATION AND CURETTAGE/HYSTEROSCOPY WITH MINERVA N/A 08/04/2021   Procedure: DILATATION AND CURETTAGE/HYSTEROSCOPY WITH MINERVA;  Surgeon: Jayne Vonn DEL, MD;  Location: [facility];  Service: Gynecology;  Laterality: N/A;   ESOPHAGOGASTRODUODENOSCOPY  12/03/2010   Fields: PUD, Bx NEGATIVE for H pylori   ESOPHAGOGASTRODUODENOSCOPY  03/15/2011   Fields: mild gastritis, PUD resolved   HYDROGEN BREATH TEST  01/12/2011   Positive SBBO   INCISIONAL HERNIA REPAIR  12/13/2010   Procedure: HERNIA REPAIR INCISIONAL;  Surgeon: Oneil DELENA Budge;  Location: [facility];  Service: General;  Laterality: N/A;  with mesh   INCISIONAL  HERNIA REPAIR  01/24/2011   Procedure: HERNIA REPAIR INCISIONAL;  Surgeon: Oneil DELENA Budge;  Location: [facility];  Service: General;  Laterality: N/A;  Recurrent Incisional Herniorraphy with Mesh   INCISIONAL HERNIA REPAIR  2013   [facility]   TUBAL LIGATION     Family History: family history includes Congenital adrenal hyperplasia in her son; Diabetes in her maternal grandfather; Thalassemia in her father. Social History:  reports that she has quit smoking. Her smoking use included cigarettes. She has a 5 pack-year smoking history. She has never used smokeless tobacco. She reports that she does not drink alcohol and does not use drugs.   Review of Systems - Patient denies fever, chills, SOB, CP, N/V/D.  History   There were no vitals taken for this visit. Exam Physical Exam   Gen: alert, well appearing, no distress Chest: nonlabored breathing CV: no peripheral edema Abdomen: soft, nontender Ext: no evidence of DVT   Assessment/Plan: Admit for planned procedure.  Preop HGB 13.7 Given surgical history and presence of mesh, will favor TAH BS as mode of hysterectomy Discussed benefits of ovarian retention.  E2/FSH testing was consistent with pre-menopause. Discussed risks, benefits, indications, recovery in office. Risks include but are not limited to bleeding, infection, damage to nearby organs including bowel,  bladder, ureter.  Alison [name] 06/14/2023, 8:14 AM

## 2023-06-15 ENCOUNTER — Observation Stay (HOSPITAL_COMMUNITY): Payer: Self-pay | Admitting: Vascular Surgery

## 2023-06-15 ENCOUNTER — Other Ambulatory Visit: Payer: Self-pay

## 2023-06-15 ENCOUNTER — Encounter (HOSPITAL_COMMUNITY): Payer: Self-pay | Admitting: Obstetrics and Gynecology

## 2023-06-15 ENCOUNTER — Encounter (HOSPITAL_COMMUNITY)
Admission: RE | Disposition: A | Discharge status: Home / Self Care | Payer: Self-pay | Source: Home / Self Care | Attending: Obstetrics and Gynecology

## 2023-06-15 ENCOUNTER — Observation Stay (HOSPITAL_COMMUNITY)
Admission: RE | Admit: 2023-06-15 | Discharge: 2023-06-17 | Disposition: A | Discharge status: Home / Self Care | Payer: BC Managed Care – PPO | Attending: Obstetrics and Gynecology | Admitting: Obstetrics and Gynecology

## 2023-06-15 ENCOUNTER — Observation Stay (HOSPITAL_COMMUNITY): Payer: Self-pay | Admitting: Anesthesiology

## 2023-06-15 ENCOUNTER — Encounter: Payer: BC Managed Care – PPO | Admitting: Physical Therapy

## 2023-06-15 DIAGNOSIS — Z87891 Personal history of nicotine dependence: Secondary | ICD-10-CM | POA: Insufficient documentation

## 2023-06-15 DIAGNOSIS — I10 Essential (primary) hypertension: Secondary | ICD-10-CM | POA: Insufficient documentation

## 2023-06-15 DIAGNOSIS — Z01818 Encounter for other preprocedural examination: Secondary | ICD-10-CM

## 2023-06-15 DIAGNOSIS — N9985 Post endometrial ablation syndrome: Principal | ICD-10-CM | POA: Insufficient documentation

## 2023-06-15 DIAGNOSIS — R102 Pelvic and perineal pain: Principal | ICD-10-CM

## 2023-06-15 HISTORY — PX: HYSTERECTOMY ABDOMINAL WITH SALPINGECTOMY: SHX6725

## 2023-06-15 LAB — POCT PREGNANCY, URINE: Preg Test, Ur: NEGATIVE

## 2023-06-15 LAB — ABO/RH: ABO/RH(D): O POS

## 2023-06-15 SURGERY — HYSTERECTOMY, TOTAL, ABDOMINAL, WITH SALPINGECTOMY
Anesthesia: General | Site: Abdomen | Laterality: Bilateral

## 2023-06-15 MED ORDER — BUPIVACAINE LIPOSOME 1.3 % IJ SUSP
INTRAMUSCULAR | Status: AC
  Filled 2023-06-15: qty 20

## 2023-06-15 MED ORDER — SUCCINYLCHOLINE CHLORIDE 200 MG/10ML IV SOSY
PREFILLED_SYRINGE | INTRAVENOUS | Status: AC
  Filled 2023-06-15: qty 10

## 2023-06-15 MED ORDER — OXYCODONE HCL 5 MG PO TABS
5.0000 mg | ORAL_TABLET | Freq: Once | ORAL | Status: AC
  Administered 2023-06-15: 5 mg via ORAL
  Filled 2023-06-15: qty 1

## 2023-06-15 MED ORDER — FENTANYL CITRATE (PF) 100 MCG/2ML IJ SOLN
25.0000 ug | INTRAMUSCULAR | Status: DC | PRN
  Administered 2023-06-15: 50 ug via INTRAVENOUS
  Administered 2023-06-15 (×2): 25 ug via INTRAVENOUS

## 2023-06-15 MED ORDER — DROPERIDOL 2.5 MG/ML IJ SOLN
0.6250 mg | Freq: Once | INTRAMUSCULAR | Status: AC | PRN
  Administered 2023-06-15: 0.625 mg via INTRAVENOUS

## 2023-06-15 MED ORDER — DROPERIDOL 2.5 MG/ML IJ SOLN
INTRAMUSCULAR | Status: AC
  Filled 2023-06-15: qty 2

## 2023-06-15 MED ORDER — SCOPOLAMINE 1 MG/3DAYS TD PT72
1.0000 | MEDICATED_PATCH | TRANSDERMAL | Status: DC
  Administered 2023-06-15: 1.5 mg via TRANSDERMAL
  Filled 2023-06-15: qty 1

## 2023-06-15 MED ORDER — LACTATED RINGERS IV SOLN: INTRAVENOUS | Status: DC

## 2023-06-15 MED ORDER — OXYCODONE HCL 5 MG PO TABS
5.0000 mg | ORAL_TABLET | Freq: Once | ORAL | Status: AC | PRN
  Administered 2023-06-15: 5 mg via ORAL

## 2023-06-15 MED ORDER — ONDANSETRON HCL 4 MG/2ML IJ SOLN: 4.0000 mg | Freq: Four times a day (QID) | INTRAMUSCULAR | Status: DC | PRN

## 2023-06-15 MED ORDER — FENTANYL CITRATE (PF) 100 MCG/2ML IJ SOLN
INTRAMUSCULAR | Status: AC
  Filled 2023-06-15: qty 2

## 2023-06-15 MED ORDER — LIDOCAINE 2% (20 MG/ML) 5 ML SYRINGE
INTRAMUSCULAR | Status: AC
  Filled 2023-06-15: qty 5

## 2023-06-15 MED ORDER — PLECANATIDE 3 MG PO TABS: 1.0000 | ORAL_TABLET | Freq: Every day | ORAL | Status: DC

## 2023-06-15 MED ORDER — SUGAMMADEX SODIUM 200 MG/2ML IV SOLN
INTRAVENOUS | Status: DC | PRN
  Administered 2023-06-15: 200 mg via INTRAVENOUS

## 2023-06-15 MED ORDER — SIMETHICONE 80 MG PO CHEW
80.0000 mg | CHEWABLE_TABLET | Freq: Four times a day (QID) | ORAL | Status: DC | PRN
  Administered 2023-06-16 (×2): 80 mg via ORAL
  Filled 2023-06-15 (×2): qty 1

## 2023-06-15 MED ORDER — CLONAZEPAM 0.5 MG PO TABS
0.5000 mg | ORAL_TABLET | Freq: Every evening | ORAL | Status: DC | PRN
Start: 2023-06-15 — End: 2023-06-17
  Administered 2023-06-16: 0.5 mg via ORAL
  Filled 2023-06-15: qty 1

## 2023-06-15 MED ORDER — SODIUM CHLORIDE (PF) 0.9 % IJ SOLN
INTRAMUSCULAR | Status: AC
  Filled 2023-06-15: qty 10

## 2023-06-15 MED ORDER — ONDANSETRON HCL 4 MG/2ML IJ SOLN
INTRAMUSCULAR | Status: DC | PRN
  Administered 2023-06-15: 4 mg via INTRAVENOUS

## 2023-06-15 MED ORDER — ALUM & MAG HYDROXIDE-SIMETH 200-200-20 MG/5ML PO SUSP: 30.0000 mL | ORAL | Status: DC | PRN

## 2023-06-15 MED ORDER — FENTANYL CITRATE (PF) 250 MCG/5ML IJ SOLN
INTRAMUSCULAR | Status: AC
  Filled 2023-06-15: qty 5

## 2023-06-15 MED ORDER — POVIDONE-IODINE 10 % EX SWAB: 2.0000 | Freq: Once | CUTANEOUS | Status: DC

## 2023-06-15 MED ORDER — BUPIVACAINE LIPOSOME 1.3 % IJ SUSP
INTRAMUSCULAR | Status: DC | PRN
  Administered 2023-06-15: 20 mL

## 2023-06-15 MED ORDER — IBUPROFEN 600 MG PO TABS
600.0000 mg | ORAL_TABLET | Freq: Four times a day (QID) | ORAL | Status: DC
  Administered 2023-06-15 – 2023-06-17 (×8): 600 mg via ORAL
  Filled 2023-06-15 (×8): qty 1

## 2023-06-15 MED ORDER — OXYCODONE HCL 5 MG PO TABS
5.0000 mg | ORAL_TABLET | ORAL | Status: DC | PRN
  Administered 2023-06-15 – 2023-06-16 (×4): 10 mg via ORAL
  Administered 2023-06-16 – 2023-06-17 (×4): 5 mg via ORAL
  Filled 2023-06-15: qty 2
  Filled 2023-06-15: qty 1
  Filled 2023-06-15 (×4): qty 2
  Filled 2023-06-15: qty 1
  Filled 2023-06-15: qty 2

## 2023-06-15 MED ORDER — HYDROCHLOROTHIAZIDE 25 MG PO TABS
12.5000 mg | ORAL_TABLET | Freq: Every day | ORAL | Status: DC
  Administered 2023-06-16 – 2023-06-17 (×2): 12.5 mg via ORAL
  Filled 2023-06-15 (×2): qty 1

## 2023-06-15 MED ORDER — ACETAMINOPHEN 10 MG/ML IV SOLN: 1000.0000 mg | Freq: Once | INTRAVENOUS | Status: DC | PRN

## 2023-06-15 MED ORDER — DEXAMETHASONE SODIUM PHOSPHATE 10 MG/ML IJ SOLN
INTRAMUSCULAR | Status: AC
  Filled 2023-06-15: qty 1

## 2023-06-15 MED ORDER — BUPIVACAINE HCL (PF) 0.5 % IJ SOLN
INTRAMUSCULAR | Status: AC
  Filled 2023-06-15: qty 30

## 2023-06-15 MED ORDER — SUCCINYLCHOLINE CHLORIDE 200 MG/10ML IV SOSY
PREFILLED_SYRINGE | INTRAVENOUS | Status: DC | PRN
  Administered 2023-06-15: 100 mg via INTRAVENOUS

## 2023-06-15 MED ORDER — CHLORHEXIDINE GLUCONATE 0.12 % MT SOLN
15.0000 mL | Freq: Once | OROMUCOSAL | Status: AC
  Administered 2023-06-15: 15 mL via OROMUCOSAL
  Filled 2023-06-15: qty 15

## 2023-06-15 MED ORDER — EPHEDRINE 5 MG/ML INJ
INTRAVENOUS | Status: AC
  Filled 2023-06-15: qty 5

## 2023-06-15 MED ORDER — LIDOCAINE 2% (20 MG/ML) 5 ML SYRINGE
INTRAMUSCULAR | Status: DC | PRN
  Administered 2023-06-15: 100 mg via INTRAVENOUS

## 2023-06-15 MED ORDER — PROPOFOL 10 MG/ML IV BOLUS
INTRAVENOUS | Status: DC | PRN
  Administered 2023-06-15: 150 mg via INTRAVENOUS
  Administered 2023-06-15: 50 ug/kg/min via INTRAVENOUS

## 2023-06-15 MED ORDER — MENTHOL 3 MG MT LOZG: 1.0000 | LOZENGE | OROMUCOSAL | Status: DC | PRN

## 2023-06-15 MED ORDER — ACETAMINOPHEN 325 MG PO TABS
650.0000 mg | ORAL_TABLET | ORAL | Status: DC | PRN
Start: 2023-06-15 — End: 2023-06-17
  Administered 2023-06-15 – 2023-06-16 (×3): 650 mg via ORAL
  Filled 2023-06-15 (×3): qty 2

## 2023-06-15 MED ORDER — DOCUSATE SODIUM 100 MG PO CAPS
100.0000 mg | ORAL_CAPSULE | Freq: Two times a day (BID) | ORAL | Status: DC
  Administered 2023-06-15 – 2023-06-17 (×4): 100 mg via ORAL
  Filled 2023-06-15 (×4): qty 1

## 2023-06-15 MED ORDER — ROCURONIUM BROMIDE 10 MG/ML (PF) SYRINGE
PREFILLED_SYRINGE | INTRAVENOUS | Status: DC | PRN
  Administered 2023-06-15: 30 mg via INTRAVENOUS
  Administered 2023-06-15: 10 mg via INTRAVENOUS

## 2023-06-15 MED ORDER — FENTANYL CITRATE (PF) 250 MCG/5ML IJ SOLN
INTRAMUSCULAR | Status: DC | PRN
  Administered 2023-06-15: 100 ug via INTRAVENOUS

## 2023-06-15 MED ORDER — ONDANSETRON HCL 4 MG PO TABS: 4.0000 mg | ORAL_TABLET | Freq: Four times a day (QID) | ORAL | Status: DC | PRN

## 2023-06-15 MED ORDER — ACETAMINOPHEN 500 MG PO TABS
1000.0000 mg | ORAL_TABLET | Freq: Once | ORAL | Status: AC
  Administered 2023-06-15: 1000 mg via ORAL
  Filled 2023-06-15: qty 2

## 2023-06-15 MED ORDER — ORAL CARE MOUTH RINSE: 15.0000 mL | Freq: Once | OROMUCOSAL | Status: AC

## 2023-06-15 MED ORDER — LACTATED RINGERS IV SOLN: INTRAVENOUS | Status: AC

## 2023-06-15 MED ORDER — OXYCODONE HCL 5 MG/5ML PO SOLN: 5.0000 mg | Freq: Once | ORAL | Status: AC | PRN

## 2023-06-15 MED ORDER — ROSUVASTATIN CALCIUM 5 MG PO TABS
5.0000 mg | ORAL_TABLET | Freq: Every day | ORAL | Status: DC
  Administered 2023-06-16: 5 mg via ORAL
  Filled 2023-06-15 (×2): qty 1

## 2023-06-15 MED ORDER — BUPIVACAINE HCL (PF) 0.5 % IJ SOLN
INTRAMUSCULAR | Status: DC | PRN
  Administered 2023-06-15: 20 mL

## 2023-06-15 MED ORDER — CEFAZOLIN SODIUM-DEXTROSE 2-4 GM/100ML-% IV SOLN
2.0000 g | INTRAVENOUS | Status: AC
  Administered 2023-06-15: 2 g via INTRAVENOUS
  Filled 2023-06-15: qty 100

## 2023-06-15 MED ORDER — MIDAZOLAM HCL 2 MG/2ML IJ SOLN
INTRAMUSCULAR | Status: AC
  Filled 2023-06-15: qty 2

## 2023-06-15 MED ORDER — MIDAZOLAM HCL 2 MG/2ML IJ SOLN
INTRAMUSCULAR | Status: DC | PRN
  Administered 2023-06-15: 2 mg via INTRAVENOUS

## 2023-06-15 MED ORDER — PAROXETINE HCL 20 MG PO TABS
40.0000 mg | ORAL_TABLET | Freq: Every day | ORAL | Status: DC
Start: 2023-06-16 — End: 2023-06-17
  Administered 2023-06-16: 40 mg via ORAL
  Filled 2023-06-15 (×2): qty 2

## 2023-06-15 MED ORDER — ZOLPIDEM TARTRATE 5 MG PO TABS: 5.0000 mg | ORAL_TABLET | Freq: Every evening | ORAL | Status: DC | PRN

## 2023-06-15 MED ORDER — ROCURONIUM BROMIDE 10 MG/ML (PF) SYRINGE
PREFILLED_SYRINGE | INTRAVENOUS | Status: AC
  Filled 2023-06-15: qty 10

## 2023-06-15 MED ORDER — EPHEDRINE SULFATE-NACL 50-0.9 MG/10ML-% IV SOSY
PREFILLED_SYRINGE | INTRAVENOUS | Status: DC | PRN
  Administered 2023-06-15: 5 mg via INTRAVENOUS

## 2023-06-15 MED ORDER — ONDANSETRON HCL 4 MG/2ML IJ SOLN
INTRAMUSCULAR | Status: AC
  Filled 2023-06-15: qty 2

## 2023-06-15 MED ORDER — DEXAMETHASONE SODIUM PHOSPHATE 10 MG/ML IJ SOLN
INTRAMUSCULAR | Status: DC | PRN
  Administered 2023-06-15: 4 mg via INTRAVENOUS

## 2023-06-15 MED ORDER — OXYCODONE HCL 5 MG PO TABS
ORAL_TABLET | ORAL | Status: AC
  Filled 2023-06-15: qty 1

## 2023-06-15 SURGICAL SUPPLY — 33 items
BAG COUNTER SPONGE SURGICOUNT (BAG) ×1 IMPLANT
BENZOIN TINCTURE PRP APPL 2/3 (GAUZE/BANDAGES/DRESSINGS) IMPLANT
CANISTER SUCT 3000ML PPV (MISCELLANEOUS) ×1 IMPLANT
DRAPE WARM FLUID 44X44 (DRAPES) IMPLANT
DRSG OPSITE POSTOP 4X10 (GAUZE/BANDAGES/DRESSINGS) ×1 IMPLANT
DURAPREP 26ML APPLICATOR (WOUND CARE) ×1 IMPLANT
GAUZE 4X4 16PLY ~~LOC~~+RFID DBL (SPONGE) IMPLANT
GLOVE ECLIPSE 7.0 STRL STRAW (GLOVE) ×1 IMPLANT
GLOVE SURG UNDER POLY LF SZ7 (GLOVE) ×2 IMPLANT
GOWN STRL REUS W/ TWL LRG LVL3 (GOWN DISPOSABLE) ×3 IMPLANT
HEMOSTAT ARISTA ABSORB 3G PWDR (HEMOSTASIS) IMPLANT
HIBICLENS CHG 4% 4OZ BTL (MISCELLANEOUS) ×1 IMPLANT
KIT TURNOVER KIT B (KITS) ×1 IMPLANT
LIGASURE IMPACT 36 18CM CVD LR (INSTRUMENTS) IMPLANT
NDL HYPO 22X1.5 SAFETY MO (MISCELLANEOUS) ×1 IMPLANT
NEEDLE HYPO 22X1.5 SAFETY MO (MISCELLANEOUS) ×1
NS IRRIG 1000ML POUR BTL (IV SOLUTION) ×1 IMPLANT
PACK ABDOMINAL GYN (CUSTOM PROCEDURE TRAY) ×1 IMPLANT
PAD ARMBOARD 7.5X6 YLW CONV (MISCELLANEOUS) ×1 IMPLANT
PAD OB MATERNITY 4.3X12.25 (PERSONAL CARE ITEMS) ×1 IMPLANT
SPECIMEN JAR MEDIUM (MISCELLANEOUS) ×1 IMPLANT
SPIKE FLUID TRANSFER (MISCELLANEOUS) ×2 IMPLANT
SPONGE T-LAP 18X18 ~~LOC~~+RFID (SPONGE) IMPLANT
STRIP CLOSURE SKIN 1/2X4 (GAUZE/BANDAGES/DRESSINGS) IMPLANT
SUT MNCRL 0 MO-4 VIOLET 18 CR (SUTURE) ×3 IMPLANT
SUT MNCRL 0 VIOLET 6X18 (SUTURE) ×1 IMPLANT
SUT MNCRL AB 0 CT1 27 (SUTURE) ×1 IMPLANT
SUT PDS AB 0 CTX 60 (SUTURE) ×2 IMPLANT
SUT PLAIN 2 0 XLH (SUTURE) IMPLANT
SUT VIC AB 2-0 CT1 TAPERPNT 27 (SUTURE) IMPLANT
SUT VIC AB 4-0 KS 27 (SUTURE) IMPLANT
TOWEL GREEN STERILE FF (TOWEL DISPOSABLE) ×2 IMPLANT
TRAY FOLEY W/BAG SLVR 14FR (SET/KITS/TRAYS/PACK) ×1 IMPLANT

## 2023-06-15 NOTE — Op Note (Signed)
 Operative Note  PREOPERATIVE DIAGNOSES: 1. Pelvic pain, post ablation  POSTOPERATIVE DIAGNOSES: Same  PROCEDURE PERFORMED: Total abdominal hysterectomy with bilateral salpingectomy  SURGEON: Dr. Massie Smiles ASSISTANT: Dr. Lynwood Clubs  ANESTHESIA: General   ESTIMATED BLOOD LOSS: 100 cc.  URINE OUTPUT: Clear urine at the end of the procedure.  FLUIDS: Per record  COMPLICATIONS: None   TUBES: None.  DRAINS: Foley to gravity.  PATHOLOGY: Uterus, cervix, bilateral fallopian tubes.  FINDINGS: On exam, under anesthesia, normal appearing vulva and vagina, normal sized uterus. 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   Procedure: The patient was prepped and draped in the usual sterile manner for an abdominal procedure. A pfannenstiel incision was made carried down to the underlying fascia. Fascia was incised in the midline and extended bilaterally with mayo scissors. Underlying rectus muscles were separated from the fascia superiorly and inferiorly in the usual fashion. Peritoneum was entered sharply with hemostat and metz with good visualization of the bladder and bowel. Once inside the abdominal cavity, a self-retaining retractor was placed. The uterus was then identified and grasped with upward traction. The round ligaments on either side were identified and individually dissected and ligated with #0 Monocryl suture and divided. This allowed us  to then create a bladder flap by both blunt and sharp dissection.  Salpingectomy was performed bilaterally by suture ligation with monocryl.   The fallopian tube and utero-ovarian ligament were isolated through the broad ligament from the uterine body and ligated and then fixated with 0 monocryl and tied with a free tie as well.    We then skeletonized the uterine vessels on either side and carefully dissected the bladder flap anteriorly.  Posteriorly, the peritoneum was dissected down toward the uterosacral ligaments. Heaney clamps were then placed at each isthmic portion of the cervical body junction where the uterine arteries adjoined the uterus. These were clamped, ligated and divided using #0 Monocryl suture. The remainder of the uterus was then removed by the clamp-cut-ligation technique using #0 Monocryl on all major pedicles. With removal of the uterus, the vaginal cuff was closed with two haney stitches and then with serial figure of either stiches using #0 Monocryl. Hemostasis was then inspected and secured throughout the entire area including the vaginal cuff, all pedicles, and the bladder. Addition monocryl was used at the posterior peritoneum for added hemostasis.  Arista was placed after irrigation. The lap sponges were then removed and the self-retaining retractor was removed. The patient tolerated the operation well. There were no complications associated with this surgical procedure to this point. The sponge count was correct times 2 at this time. The Foley catheter was inspected and clear urine was noted. Having removed all instruments and packs, we then began closure of the abdomen. The peritoneum was closed with #2-0 vicryl in a running continuous manner. The fascia was closed with #0 Loop PDS in a running continuous manner.  Exparel /Marcaine  was then injected for pain control. Subcutaneous tissue was also closed with #2-0 plain gut in a running continuous manner. Hemostasis was secured throughout the entire layers. The incision was then closed as noted in the above operative findings. The patient tolerated the operation nicely and was then taken to the Recovery Room in good condition.   Massie Smiles MD

## 2023-06-15 NOTE — Transfer of Care (Signed)
 Immediate Anesthesia Transfer of Care Note  Patient: Alison [name]  Procedure(s) Performed: HYSTERECTOMY ABDOMINAL WITH SALPINGECTOMY (Bilateral: Abdomen)  Patient Location: PACU  Anesthesia Type:General  Level of Consciousness: awake  Airway & Oxygen Therapy: Patient Spontanous Breathing  Post-op Assessment: Report given to RN  Post vital signs: Reviewed and stable  Last Vitals:  Vitals Value Taken Time  BP 111/68 06/15/23 0945  Temp    Pulse 72 06/15/23 0948  Resp 13 06/15/23 0948  SpO2 98 % 06/15/23 0948  Vitals shown include unfiled device data.  Last Pain:  Vitals:   06/15/23 0612  TempSrc: Oral  PainSc:       Patients Stated Pain Goal: 0 (06/15/23 0610)  Complications: No notable events documented.

## 2023-06-15 NOTE — Anesthesia Procedure Notes (Signed)
 Procedure Name: Intubation Date/Time: 06/15/2023 7:48 AM  Performed by: Crisoforo Burnard KIDD, CRNAPre-anesthesia Checklist: Patient identified, Emergency Drugs available, Suction available, Patient being monitored and Timeout performed Patient Re-evaluated:Patient Re-evaluated prior to induction Oxygen Delivery Method: Circle system utilized Preoxygenation: Pre-oxygenation with 100% oxygen Induction Type: IV induction Ventilation: Mask ventilation without difficulty Laryngoscope Size: Mac and 3 Grade View: Grade I Tube type: Oral Tube size: 6.5 mm Number of attempts: 1 Airway Equipment and Method: Stylet Placement Confirmation: ETT inserted through vocal cords under direct vision, positive ETCO2 and breath sounds checked- equal and bilateral Secured at: 20 cm Tube secured with: Tape Dental Injury: Teeth and Oropharynx as per pre-operative assessment

## 2023-06-15 NOTE — Progress Notes (Signed)
 No changes to above H&P  Procedure again reviewed with patient.  Discussed risks, which include but are not limited to bleeding, infection, damage to nearby organs.  Plan for TAH BS.   BP 122/89   Pulse 63   Temp 98.1 F (36.7 C) (Oral)   Resp 18   Ht 5' 6 (1.676 m)   Wt 104.3 kg   SpO2 97%   BMI 37.12 kg/m   Will proceed as planned.   Massie Smiles

## 2023-06-15 NOTE — Anesthesia Postprocedure Evaluation (Signed)
 Anesthesia Post Note  Patient: Alison [name]  Procedure(s) Performed: HYSTERECTOMY ABDOMINAL WITH SALPINGECTOMY (Bilateral: Abdomen)     Patient location during evaluation: PACU Anesthesia Type: General Level of consciousness: awake and alert Pain management: pain level controlled Vital Signs Assessment: post-procedure vital signs reviewed and stable Respiratory status: spontaneous breathing, nonlabored ventilation, respiratory function stable and patient connected to nasal cannula oxygen Cardiovascular status: blood pressure returned to baseline and stable Postop Assessment: no apparent nausea or vomiting Anesthetic complications: no   No notable events documented.  Last Vitals:  Vitals:   06/15/23 1118 06/15/23 1203  BP: 113/64 110/62  Pulse: 71 73  Resp: 15 16  Temp: 36.7 C 36.8 C  SpO2: 97% 96%    Last Pain:  Vitals:   06/15/23 1203  TempSrc: Oral  PainSc: 5                  Thom JONELLE Peoples

## 2023-06-16 ENCOUNTER — Encounter (HOSPITAL_COMMUNITY): Payer: Self-pay | Admitting: Obstetrics and Gynecology

## 2023-06-16 DIAGNOSIS — I10 Essential (primary) hypertension: Secondary | ICD-10-CM | POA: Diagnosis not present

## 2023-06-16 DIAGNOSIS — N9985 Post endometrial ablation syndrome: Secondary | ICD-10-CM | POA: Diagnosis not present

## 2023-06-16 DIAGNOSIS — Z87891 Personal history of nicotine dependence: Secondary | ICD-10-CM | POA: Diagnosis not present

## 2023-06-16 LAB — CBC
HCT: 32.3 % — ABNORMAL LOW (ref 36.0–46.0)
HCT: 33 % — ABNORMAL LOW (ref 36.0–46.0)
Hemoglobin: 11.2 g/dL — ABNORMAL LOW (ref 12.0–15.0)
Hemoglobin: 11.2 g/dL — ABNORMAL LOW (ref 12.0–15.0)
MCH: 31.9 pg (ref 26.0–34.0)
MCH: 31.9 pg (ref 26.0–34.0)
MCHC: 34.7 g/dL (ref 30.0–36.0)
MCHC: 33.9 g/dL (ref 30.0–36.0)
MCV: 92 fL (ref 80.0–100.0)
MCV: 94 fL (ref 80.0–100.0)
Platelets: 236 10*3/uL (ref 150–400)
Platelets: 222 10*3/uL (ref 150–400)
RBC: 3.51 MIL/uL — ABNORMAL LOW (ref 3.87–5.11)
RBC: 3.51 MIL/uL — ABNORMAL LOW (ref 3.87–5.11)
RDW: 13 % (ref 11.5–15.5)
RDW: 13.1 % (ref 11.5–15.5)
WBC: 10 10*3/uL (ref 4.0–10.5)
WBC: 7.4 10*3/uL (ref 4.0–10.5)
nRBC: 0 % (ref 0.0–0.2)
nRBC: 0 % (ref 0.0–0.2)

## 2023-06-16 LAB — SURGICAL PATHOLOGY

## 2023-06-16 MED ORDER — DOCUSATE SODIUM 100 MG PO CAPS: 100.0000 mg | ORAL_CAPSULE | Freq: Two times a day (BID) | ORAL | 0 refills | Status: DC

## 2023-06-16 MED ORDER — TRAMADOL HCL 50 MG PO TABS
50.0000 mg | ORAL_TABLET | Freq: Four times a day (QID) | ORAL | Status: DC | PRN
  Administered 2023-06-16 – 2023-06-17 (×2): 50 mg via ORAL
  Filled 2023-06-16 (×2): qty 1

## 2023-06-16 MED ORDER — ONDANSETRON HCL 4 MG PO TABS: 4.0000 mg | ORAL_TABLET | Freq: Four times a day (QID) | ORAL | 0 refills | Status: AC | PRN

## 2023-06-16 MED ORDER — LACTATED RINGERS IV BOLUS
500.0000 mL | Freq: Once | INTRAVENOUS | Status: AC
  Administered 2023-06-16: 500 mL via INTRAVENOUS

## 2023-06-16 MED ORDER — GABAPENTIN 300 MG PO CAPS
300.0000 mg | ORAL_CAPSULE | Freq: Three times a day (TID) | ORAL | Status: DC
  Administered 2023-06-16 – 2023-06-17 (×2): 300 mg via ORAL
  Filled 2023-06-16 (×2): qty 1

## 2023-06-16 MED ORDER — DIPHENHYDRAMINE HCL 25 MG PO CAPS
25.0000 mg | ORAL_CAPSULE | Freq: Four times a day (QID) | ORAL | Status: DC | PRN
  Administered 2023-06-16 (×2): 25 mg via ORAL
  Filled 2023-06-16 (×2): qty 1

## 2023-06-16 MED ORDER — OXYCODONE HCL 5 MG PO TABS: 5.0000 mg | ORAL_TABLET | ORAL | 0 refills | Status: AC | PRN

## 2023-06-16 MED ORDER — ACETAMINOPHEN 325 MG PO TABS: 650.0000 mg | ORAL_TABLET | ORAL | 0 refills | Status: AC | PRN

## 2023-06-16 MED ORDER — SENNA 8.6 MG PO TABS
1.0000 | ORAL_TABLET | Freq: Every day | ORAL | Status: DC
  Administered 2023-06-16 – 2023-06-17 (×2): 8.6 mg via ORAL
  Filled 2023-06-16 (×2): qty 1

## 2023-06-16 MED ORDER — IBUPROFEN 600 MG PO TABS: 600.0000 mg | ORAL_TABLET | Freq: Four times a day (QID) | ORAL | 0 refills | Status: AC

## 2023-06-16 NOTE — Progress Notes (Signed)
 HGB stable at 11.2 (from 11.2). Given reassuring exam, absence of tachycardia, good urine output, low concern for intraabdominal process.   Her fatigue may be from pain medication, as she is requiring 10 mg of schedule oxycodone . This also may be the source of mild itching reported.   Will try and decrease need for oxycodone , switch to 5 mg when able, alternate with tramadol , and add Gabapentin  (I.e. ERAS protocol).  Still awaiting flatus. She has chronic constipation, for which narcotics will also make challenging.   Alison [name]

## 2023-06-16 NOTE — Progress Notes (Signed)
 Gynecology Progress Note  Alison [name] is POD#1 s/p TAHBS.  Subjective:  Patient reports no overnight events.  She reports well controlled pain, ambulating without difficulty, voiding spontaneously, tolerating PO.  She reports Negative flatus, Negative BM.  Vaginal bleeding is absent.  Objective: Blood pressure 123/62, pulse 76, temperature 98.6 F (37 C), temperature source Oral, resp. rate 18, height 5' 6 (1.676 m), weight 104.3 kg, SpO2 97%.  Physical Exam:  Gen: alert, well appearing, no distress Chest: nonlabored breathing CV: no peripheral edema Abdomen: soft, nondistended, ATTP Incision: clean/dry/intact Ext: No evidence of DVT  Recent Labs    06/16/23 0358  HGB 11.2*  HCT 32.3*    Assessment/Plan: Postoperative day #1, s/p TAH Urine output adequate Incision reassuring, postoperative care discussed. She has chronic constipation. Discussed importance of bowel regimen, risk of ileus with postoperative state and pain medications. Awaiting flatus. Precautions reviewed.  Doing well, continue routine care. Anticipate discharge home if she continues to feel well, passes flatus.   LOS: 0 days   Alison [name] 06/16/2023, 7:39 AM

## 2023-06-16 NOTE — Plan of Care (Signed)
  Problem: Education: Goal: Knowledge of General Education information will improve Description: Including pain rating scale, medication(s)/side effects and non-pharmacologic comfort measures Outcome: Progressing   Problem: Health Behavior/Discharge Planning: Goal: Ability to manage health-related needs will improve Outcome: Progressing   Problem: Clinical Measurements: Goal: Ability to maintain clinical measurements within normal limits will improve Outcome: Progressing   Problem: Clinical Measurements: Goal: Will remain free from infection Outcome: Progressing   Problem: Clinical Measurements: Goal: Respiratory complications will improve Outcome: Progressing   

## 2023-06-16 NOTE — Progress Notes (Signed)
 At bedside to check on patient.   She has been active this morning, walking the halls.  This afternoon she has felt somewhat lightheaded. Occasionally she feels that her heart skips a beat.  She denies bloating, distention.  Pain is well controlled, except for burning at incision site.  Denies vaginal bleeding or pelvic pressure. She denies fever, chills, CP, SOB.   She has continued to void spontaneously.  PO intake adequate. She has not passed flatus yet.   BP (!) 110/52   Pulse 67   Temp 98.3 F (36.8 C) (Oral)   Resp 20   Ht 5' 6 (1.676 m)   Wt 104.3 kg   SpO2 96%   BMI 37.12 kg/m   Gen: alert, well appearing, no distress Chest: nonlabored breathing CV: no peripheral edema Abdomen: soft, ATTP Ext: no evidence of DVT  EKG with normal sinus rhythm  A/P: Some relative hypotension noted.  She is well appearing in bed, so signs of acute process, no tachycardia.  Advised continued hydration and prioritize rest this afternoon. Will decrease oxy to 5 mg dosing.  Will continue to await flatus.  If she feels better we can consider discharge, otherwise offer additional night stay with CBC in AM.   Massie Smiles

## 2023-06-17 DIAGNOSIS — N9985 Post endometrial ablation syndrome: Secondary | ICD-10-CM | POA: Diagnosis not present

## 2023-06-17 MED ORDER — TRAMADOL HCL 50 MG PO TABS: 50.0000 mg | ORAL_TABLET | Freq: Four times a day (QID) | ORAL | 0 refills | Status: DC | PRN

## 2023-06-17 MED ORDER — SENNA 8.6 MG PO TABS: 1.0000 | ORAL_TABLET | Freq: Every day | ORAL | 0 refills | Status: AC

## 2023-06-17 NOTE — Progress Notes (Signed)
 Gynecology Progress Note  Alison [name] is POD#2 s/p TAHBS.  Subjective:  Patient reports no overnight events.  She reports well controlled pain, ambulating without difficulty, voiding spontaneously, tolerating PO. She still has a bit of dizziness.  She has passed flatus multiple times overnight. She denies vaginal bleeding.  Objective: Blood pressure 111/73, pulse 68, temperature 98 F (36.7 C), temperature source Oral, resp. rate 14, height 5' 6 (1.676 m), weight 104.3 kg, SpO2 99%.  Physical Exam:  Gen: alert, well appearing, no distress Chest: nonlabored breathing CV: no peripheral edema Abdomen: soft, nondistended, ATTP Incision: clean/dry/intact Ext: No evidence of DVT  Recent Labs    06/16/23 0358 06/16/23 1744  HGB 11.2* 11.2*  HCT 32.3* 33.0*    Assessment/Plan: Postoperative day #2, s/p TAH Stayed overnight for feeling fatigued and awaiting flatus Serial CBC checked yesterday evening and stable 11.2 > 11.2 Now passing flatus Suspect narcotic side effects, tramadol  added to alternate with oxycodone  and gabapentin  added. Continue bowel regimen, ileus precautions discussed.  Doing well, continue routine care. Anticipate discharge home today   LOS: 0 days   Alison [name] 06/17/2023, 8:10 AM

## 2023-06-17 NOTE — Discharge Summary (Signed)
 Gynecology Discharge Summary  Alison [name] is a 48 y.o. female that presented on 06/15/2023 for scheduled total abdominal hysterectomy with bilateral salpingectomy for postablation pain.  She underwent her procedure as planned and postoperative course was uncomplicated. She had some postoperative fatigue. Repeat CBC was stable and she did not exhibit any signs of acute abdominal  process.  She has a history of chronic constipation and stayed inpatient unless passage of flatus.  On POD#2, she reported well controlled pain, ambulating without difficulty, voiding spontaneously.  She was discharged home in stable condition with plans for in-office follow up.   Hemoglobin  Date Value Ref Range Status  06/16/2023 11.2 (L) 12.0 - 15.0 g/dL Final   HCT  Date Value Ref Range Status  06/16/2023 33.0 (L) 36.0 - 46.0 % Final    Physical Exam:  Gen: alert, well appearing, no distress Chest: nonlabored breathing CV: no peripheral edema Abdomen: soft, nondistended, Incision: dressing in place, clean, dry, intact Ext: no evidence of DVT  Discharge Diagnoses: s/p TAH BS  Discharge Information: Date: 06/17/2023 Activity: Pelvic rest, as tolerated Diet: routine Medications: Tylenol , motrin , oxycodone , tramadol , senna, colace Condition: stable Instructions: Discussed prior to discharge.  Discharge to: Home  Follow-up Information     [place] Follow up.   Why: Please follow up for a 2 week postoperative visit. Contact information: [address] [place] [address] [phone]                  Alison [name] 06/17/2023, 4:28 PM

## 2023-06-17 NOTE — Plan of Care (Signed)
  Problem: Education: Goal: Knowledge of General Education information will improve Description: Including pain rating scale, medication(s)/side effects and non-pharmacologic comfort measures Outcome: Completed/Met   Problem: Health Behavior/Discharge Planning: Goal: Ability to manage health-related needs will improve Outcome: Completed/Met   Problem: Clinical Measurements: Goal: Ability to maintain clinical measurements within normal limits will improve Outcome: Completed/Met Goal: Will remain free from infection Outcome: Completed/Met Goal: Diagnostic test results will improve Outcome: Completed/Met Goal: Respiratory complications will improve Outcome: Completed/Met Goal: Cardiovascular complication will be avoided Outcome: Completed/Met   Problem: Activity: Goal: Risk for activity intolerance will decrease Outcome: Completed/Met   Problem: Nutrition: Goal: Adequate nutrition will be maintained Outcome: Completed/Met   Problem: Coping: Goal: Level of anxiety will decrease Outcome: Completed/Met   Problem: Elimination: Goal: Will not experience complications related to bowel motility Outcome: Completed/Met Goal: Will not experience complications related to urinary retention Outcome: Completed/Met   Problem: Pain Managment: Goal: General experience of comfort will improve and/or be controlled Outcome: Completed/Met   Problem: Safety: Goal: Ability to remain free from injury will improve Outcome: Completed/Met   Problem: Skin Integrity: Goal: Risk for impaired skin integrity will decrease Outcome: Completed/Met   Problem: Education: Goal: Knowledge of the prescribed therapeutic regimen will improve Outcome: Completed/Met Goal: Understanding of sexual limitations or changes related to disease process or condition will improve Outcome: Completed/Met Goal: Individualized Educational Video(s) Outcome: Completed/Met   Problem: Self-Concept: Goal: Communication of  feelings regarding changes in body function or appearance will improve Outcome: Completed/Met   Problem: Skin Integrity: Goal: Demonstration of wound healing without infection will improve Outcome: Completed/Met

## 2023-06-22 ENCOUNTER — Encounter: Payer: BC Managed Care – PPO | Admitting: Physical Therapy

## 2023-06-29 ENCOUNTER — Encounter: Payer: BC Managed Care – PPO | Admitting: Physical Therapy

## 2023-07-06 ENCOUNTER — Encounter: Payer: BC Managed Care – PPO | Admitting: Physical Therapy

## 2023-08-28 ENCOUNTER — Other Ambulatory Visit: Payer: Self-pay | Admitting: Family Medicine

## 2023-08-28 DIAGNOSIS — Z1231 Encounter for screening mammogram for malignant neoplasm of breast: Secondary | ICD-10-CM

## 2023-10-11 ENCOUNTER — Ambulatory Visit
Admission: RE | Admit: 2023-10-11 | Discharge: 2023-10-11 | Disposition: A | Discharge status: Home / Self Care | Source: Ambulatory Visit | Attending: Family Medicine | Admitting: Family Medicine

## 2023-10-11 DIAGNOSIS — Z1231 Encounter for screening mammogram for malignant neoplasm of breast: Secondary | ICD-10-CM

## 2023-10-17 NOTE — Progress Notes (Unsigned)
 10/18/2023 Alison [name] [id number] 1975/08/11  Referring provider: Fredick Jarred, PA-C Primary GI doctor: Dr. Venice Gillis  ASSESSMENT AND PLAN:   GERD with history of peptic ulcer disease 2012 secondary to NSAIDs 11/29/2022 EGD for reflux showed 3 cm hiatal hernia erythematous mucosa in the stomach normal duodenum Path showed non-H. pylori gastritis and negative celiac Lifestyle changes discussed, avoid NSAIDS, ETOH - well controlled at this time  IBS- diarrhea worse status post cholecystectomy, laxative dependent  positive hydrogen breath test 12/2012 treat empirically with antibiotics without much help likely pelvic floor prolapse component with urinary symptoms Patient status post partial hysterectomy with plan of pelvic floor physical therapy after that to help alleviate symptoms. Patient has been laxative dependent, failed MiraLAX, fiber, stool softeners - Given Linzess  however cannot tolerate has been on Trulance  however continues to have to take laxative with alternating constipation/blow out diarrhea with bloating, nausea.  - add on benefiber  - will refer to pelvic floor PT, consider anal rectal manometry - will get CT AB and pelvis with symptoms and history of hernia, rule out pSBO/etc  Personal history of colon polyps 11/29/2022 colonoscopy for screening adequate bowel prep.  No skin tags, hemorrhoids with 3 polyps 3 to 7 mm rectosigmoid colon normal mucosa nonbleeding external and internal hemorrhoids TA polyps and hyperplastic polyp recall 7 years  History of dysmenorrhea/rectocele/pelvic floor dysfunction Has 2 kids and 2 grandkids History of endometrial ablation 07/29/2021 Partial hysterectomy 06/2023  Patient Care Team: Fredick Jarred, PA-C as PCP - General (Family Medicine) Alyce Jubilee, MD (Inactive) (Gastroenterology)  HISTORY OF PRESENT ILLNESS: 48 y.o. female with a past medical history of peptic ulcer disease, alternating diarrhea constipation, SIBO,  and others listed below presents for evaluation of GERD, constipation/diarrhea/AB pain.   Patient was last seen in the office by myself 10/12/2022. Patient previously seen at [facility].  Discussed the use of AI scribe software for clinical note transcription with the patient, who gave verbal consent to proceed.  History of Present Illness   Alison [name] is a 48 year old female with a history of hiatal hernia and gallbladder removal who presents with ongoing gastrointestinal symptoms.  She experiences ongoing issues with constipation and diarrhea, accompanied by abdominal pain. Bowel movements are described as either 'blowout diarrhea' or constipation, sometimes lasting a day or two before she can go. Bloating is particularly noted at the top of her stomach, and she sometimes feels nauseous. Despite being on Trulance , she still requires a gentle laxative to manage her symptoms.  An endoscopy in July 2024 revealed a 3 cm hiatal hernia and inflammation, but no infection or celiac disease. She has a history of gallbladder removal and previously underwent a hydrogen breath test in 2014, which was treated with antibiotics without improvement. She was unable to tolerate Linzess  and is currently on Trulance , though she feels it is not fully effective.  She had a hysterectomy in February 2025. At her follow-up, she was informed that everything had healed fine, but her pelvic floor was 'shot'. She was referred to pelvic floor therapy, which she has not yet started, preferring a location closer to her work.  She has to manipulate her bottom to pass stool, describing them as soft and small. She sometimes experiences urgency and has had accidents. No weight loss, but she reports abdominal discomfort and occasional lower back pain. No fevers or chills.  Her family history includes her daughter, who has also had her gallbladder removed and is experiencing  similar stomach issues.       She  reports that  she has quit smoking. Her smoking use included cigarettes. She has a 5 pack-year smoking history. She has never used smokeless tobacco. She reports that she does not drink alcohol and does not use drugs.  RELEVANT LABS AND IMAGING: CBC    Component Value Date/Time   WBC 7.4 06/16/2023 1744   RBC 3.51 (L) 06/16/2023 1744   HGB 11.2 (L) 06/16/2023 1744   HCT 33.0 (L) 06/16/2023 1744   PLT 222 06/16/2023 1744   MCV 94.0 06/16/2023 1744   MCH 31.9 06/16/2023 1744   MCHC 33.9 06/16/2023 1744   RDW 13.1 06/16/2023 1744   LYMPHSABS 1.4 10/12/2022 0931   MONOABS 0.5 10/12/2022 0931   EOSABS 0.1 10/12/2022 0931   BASOSABS 0.1 10/12/2022 0931   Recent Labs    06/12/23 0932 06/16/23 0358 06/16/23 1744  HGB 13.7 11.2* 11.2*    CMP     Component Value Date/Time   NA 141 06/12/2023 0932   K 3.7 06/12/2023 0932   CL 106 06/12/2023 0932   CO2 25 06/12/2023 0932   GLUCOSE 91 06/12/2023 0932   BUN <5 (L) 06/12/2023 0932   CREATININE 0.82 06/12/2023 0932   CALCIUM  9.5 06/12/2023 0932   PROT 7.5 10/12/2022 0931   ALBUMIN 4.6 10/12/2022 0931   AST 16 10/12/2022 0931   ALT 15 10/12/2022 0931   ALKPHOS 63 10/12/2022 0931   BILITOT 0.4 10/12/2022 0931   GFRNONAA >60 06/12/2023 0932   GFRAA >60 01/19/2011 1033      Latest Ref Rng & Units 10/12/2022    9:31 AM 10/13/2021   11:22 PM 08/02/2021    8:47 AM  Hepatic Function  Total Protein 6.0 - 8.3 g/dL 7.5  6.4  7.1   Albumin 3.5 - 5.2 g/dL 4.6  3.8  4.1   AST 0 - 37 U/L 16  23  14    ALT 0 - 35 U/L 15  14  15    Alk Phosphatase 39 - 117 U/L 63  72  72   Total Bilirubin 0.2 - 1.2 mg/dL 0.4  0.3  0.6       Current Medications:    Current Outpatient Medications (Cardiovascular):    hydrochlorothiazide  (HYDRODIURIL ) 12.5 MG tablet, Take 12.5 mg by mouth daily.   rosuvastatin  (CRESTOR ) 5 MG tablet, Take 5 mg by mouth daily.   valsartan (DIOVAN) 40 MG tablet, Take 40 mg by mouth daily.  Current Outpatient Medications (Respiratory):     fexofenadine (ALLEGRA) 180 MG tablet, Take 180 mg by mouth daily.  Current Outpatient Medications (Analgesics):    acetaminophen  (TYLENOL ) 325 MG tablet, Take 2 tablets (650 mg total) by mouth every 4 (four) hours as needed for mild pain (pain score 1-3) (temperature > 101.5.).   ibuprofen  (ADVIL ) 600 MG tablet, Take 1 tablet (600 mg total) by mouth every 6 (six) hours.   oxyCODONE  (OXY IR/ROXICODONE ) 5 MG immediate release tablet, Take 1 tablet (5 mg total) by mouth every 4 (four) hours as needed for moderate pain (pain score 4-6).   Current Outpatient Medications (Other):    Cholecalciferol (VITAMIN D3 PO), Take 1 tablet by mouth daily.   clonazePAM  (KLONOPIN ) 0.5 MG tablet, Take 0.5 mg by mouth at bedtime as needed (sleep).   omeprazole  (PRILOSEC) 40 MG capsule, Take 1 capsule (40 mg total) by mouth daily.   ondansetron  (ZOFRAN ) 4 MG tablet, Take 1 tablet (4 mg total) by mouth every  6 (six) hours as needed for nausea.   PARoxetine  (PAXIL ) 40 MG tablet, Take 40 mg by mouth daily.   Plecanatide  (TRULANCE ) 3 MG TABS, Take 1 tablet (3 mg total) by mouth daily.   rifaximin  (XIFAXAN ) 550 MG TABS tablet, Take 1 tablet (550 mg total) by mouth 3 (three) times daily for 14 days.   senna (SENOKOT) 8.6 MG TABS tablet, Take 1 tablet (8.6 mg total) by mouth daily.  Medical History:  Past Medical History:  Diagnosis Date   Anxiety    Bacterial overgrowth syndrome 05/09/2010   Bloating 05/09/2010   SEP 2012 HBT C/W SIBO(1ST pk 15, 2nd pk 21)   GERD (gastroesophageal reflux disease)    Hypertension    Pneumonia    PONV (postoperative nausea and vomiting)    Ulcer 12/03/2010   Allergies:  Allergies  Allergen Reactions   Prednisone Nausea And Vomiting   Doxycycline  Nausea And Vomiting     Surgical History:  She  has a past surgical history that includes Cesarean section (X2); Cholecystectomy (APR 2012 [facility]); Tubal ligation; Esophagogastroduodenoscopy (12/03/2010); Incisional hernia repair  (12/13/2010); hydrogen breath test (01/12/2011); Incisional hernia repair (01/24/2011); Esophagogastroduodenoscopy (03/15/2011); Dilatation and curettage/hysteroscopy with minerva (N/A, 08/04/2021); Incisional hernia repair (2013); and Hysterectomy abdominal with salpingectomy (Bilateral, 06/15/2023). Family History:  Her family history includes Congenital adrenal hyperplasia in her son; Diabetes in her maternal grandfather; Thalassemia in her father.  REVIEW OF SYSTEMS  : All other systems reviewed and negative except where noted in the History of Present Illness.  PHYSICAL EXAM: BP 122/88   Pulse 84   Ht 5' 5 (1.651 m)   Wt 228 lb 12.8 oz (103.8 kg)   LMP 07/09/2021   BMI 38.07 kg/m  General Appearance: Well nourished, in no apparent distress. Head:   Normocephalic and atraumatic. Eyes:  sclerae anicteric,conjunctive pink  Respiratory: Respiratory effort normal, BS equal bilaterally without rales, rhonchi, wheezing. Cardio: RRR with no MRGs. Peripheral pulses intact.  Abdomen: Soft,  Obese ,active bowel sounds. mild tenderness in the lower abdomen. Without guarding and Without rebound. No masses. Rectal: Not evaluated Musculoskeletal: Full ROM, Normal gait. Without edema. Skin:  Dry and intact without significant lesions or rashes Neuro: Alert and  oriented x4;  No focal deficits. Psych:  Cooperative. Normal mood and affect.    Edmonia Gottron, PA-C 8:56 AM

## 2023-10-18 ENCOUNTER — Ambulatory Visit: Admitting: Physician Assistant

## 2023-10-18 ENCOUNTER — Encounter: Payer: Self-pay | Admitting: Physician Assistant

## 2023-10-18 ENCOUNTER — Other Ambulatory Visit

## 2023-10-18 VITALS — BP 122/88 | HR 84 | Ht 65.0 in | Wt 228.8 lb

## 2023-10-18 DIAGNOSIS — K219 Gastro-esophageal reflux disease without esophagitis: Secondary | ICD-10-CM

## 2023-10-18 DIAGNOSIS — Z8719 Personal history of other diseases of the digestive system: Secondary | ICD-10-CM

## 2023-10-18 DIAGNOSIS — R1084 Generalized abdominal pain: Secondary | ICD-10-CM

## 2023-10-18 DIAGNOSIS — R109 Unspecified abdominal pain: Secondary | ICD-10-CM

## 2023-10-18 DIAGNOSIS — K56600 Partial intestinal obstruction, unspecified as to cause: Secondary | ICD-10-CM

## 2023-10-18 DIAGNOSIS — K580 Irritable bowel syndrome with diarrhea: Secondary | ICD-10-CM | POA: Diagnosis not present

## 2023-10-18 DIAGNOSIS — Z860100 Personal history of colon polyps, unspecified: Secondary | ICD-10-CM | POA: Diagnosis not present

## 2023-10-18 LAB — COMPREHENSIVE METABOLIC PANEL WITH GFR
AG Ratio: 2 (calc) (ref 1.0–2.5)
ALT: 27 U/L (ref 6–29)
AST: 25 U/L (ref 10–35)
Albumin: 4.9 g/dL (ref 3.6–5.1)
Alkaline phosphatase (APISO): 70 U/L (ref 31–125)
BUN: 9 mg/dL (ref 7–25)
CO2: 25 mmol/L (ref 20–32)
Calcium: 10.1 mg/dL (ref 8.6–10.2)
Chloride: 104 mmol/L (ref 98–110)
Creat: 0.75 mg/dL (ref 0.50–0.99)
Globulin: 2.4 g/dL (ref 1.9–3.7)
Glucose, Bld: 79 mg/dL (ref 65–99)
Potassium: 3.7 mmol/L (ref 3.5–5.3)
Sodium: 138 mmol/L (ref 135–146)
Total Bilirubin: 0.4 mg/dL (ref 0.2–1.2)
Total Protein: 7.3 g/dL (ref 6.1–8.1)
eGFR: 98 mL/min/{1.73_m2} (ref >=60)

## 2023-10-18 LAB — CBC WITH DIFFERENTIAL/PLATELET
Absolute Lymphocytes: 1630 {cells}/uL (ref 850–3900)
Absolute Monocytes: 564 {cells}/uL (ref 200–950)
Basophils Absolute: 57 {cells}/uL (ref 0–200)
Basophils Relative: 1 %
Eosinophils Absolute: 108 {cells}/uL (ref 15–500)
Eosinophils Relative: 1.9 %
HCT: 44.3 % (ref 35.0–45.0)
Hemoglobin: 14.8 g/dL (ref 11.7–15.5)
MCH: 31.6 pg (ref 27.0–33.0)
MCHC: 33.4 g/dL (ref 32.0–36.0)
MCV: 94.5 fL (ref 80.0–100.0)
MPV: 10.6 fL (ref 7.5–12.5)
Monocytes Relative: 9.9 %
Neutro Abs: 3340 {cells}/uL (ref 1500–7800)
Neutrophils Relative %: 58.6 %
Platelets: 297 10*3/uL (ref 140–400)
RBC: 4.69 10*6/uL (ref 3.80–5.10)
RDW: 13.5 % (ref 11.0–15.0)
Total Lymphocyte: 28.6 %
WBC: 5.7 10*3/uL (ref 3.8–10.8)

## 2023-10-18 MED ORDER — RIFAXIMIN 550 MG PO TABS: 550.0000 mg | ORAL_TABLET | Freq: Three times a day (TID) | ORAL | 0 refills | Status: AC

## 2023-10-18 NOTE — Patient Instructions (Addendum)
 _______________________________________________________  If your blood pressure at your visit was 140/90 or greater, please contact your primary care physician to follow up on this.  If you are age 48 or younger, your body mass index should be between 19-25. Your Body mass index is 38.07 kg/m. If this is out of the aformentioned range listed, please consider follow up with your Primary Care Provider.  ________________________________________________________  The [facility] providers would like to encourage you to use MYCHART to communicate with providers for non-urgent requests or questions.  Due to long hold times on the telephone, sending your provider a message by [facility] may be a faster and more efficient way to get a response.  Please allow 48 business hours for a response.  Please remember that this is for non-urgent requests.  _______________________________________________________  We have sent the following medications to your pharmacy for you to pick up at your convenience:  START: Xifaxin 550mg  one tablet three times per day  Your provider has requested that you go to the basement level for lab work before leaving today. Press B on the elevator. The lab is located at the first door on the left as you exit the elevator.  You have been scheduled for a CT scan of the abdomen and pelvis at [facility] You are scheduled on 10-27-23 at 5pm. You should arrive 15 minutes prior to your appointment time for registration.   If you have any questions regarding your exam or if you need to reschedule, you may call [facility] at [phone] between the hours of 8:00 am and 5:00 pm, Monday-Friday.   Due to recent changes in healthcare laws, you may see the results of your imaging and laboratory studies on MyChart before your provider has had a chance to review them.  We understand that in some cases there may be results that are confusing or concerning to you. Not all laboratory results  come back in the same time frame and the provider may be waiting for multiple results in order to interpret others.  Please give us  48 hours in order for your provider to thoroughly review all the results before contacting the office for clarification of your results.   FIBER SUPPLEMENT You can do metamucil or fibercon once or twice a day but if this causes gas/bloating please switch to Benefiber or Citracel.  Fiber is good for constipation/diarrhea/irritable bowel syndrome.  It can also help with weight loss and can help lower your bad cholesterol (LDL).  Please do 1 TBSP in the morning in water , coffee, or tea.  It can take up to a month before you can see a difference with your bowel movements.  It is cheapest from [employer], [employer], [employer].  Here some information about pelvic floor dysfunction. This may be contributing to some of your symptoms. We will continue with our evaluation but I do want you to consider adding on fiber supplement with low-dose MiraLAX daily. We have refered to pelvic floor physical therapy.   Pelvic Floor Dysfunction, Female Pelvic floor dysfunction (PFD) is a condition that results when the group of muscles and connective tissues that support the organs in the pelvis (pelvic floor muscles) do not work well. These muscles and their connections form a sling that supports the colon and bladder. In women, they also support the uterus. PFD causes pelvic floor muscles to be too weak, too tight, or both. In PFD, muscle movements are not coordinated. This may cause bowel or bladder problems. It may also cause pain.  What are the causes? This condition may be caused by an injury to the pelvic area or by a weakening of pelvic muscles. This often results from pregnancy and childbirth or other types of strain. In many cases, the exact cause is not known. What increases the risk? The following factors may make you more likely to develop this condition: Having chronic bladder tissue  inflammation (interstitial cystitis). Being an older person. Being overweight. History of radiation treatment for cancer in the pelvic region. Previous pelvic surgery, such as removal of the uterus (hysterectomy). What are the signs or symptoms? Symptoms of this condition vary and may include: Bladder symptoms, such as: Trouble starting urination and emptying the bladder. Frequent urinary tract infections. Leaking urine when coughing, laughing, or exercising (stress incontinence). Having to pass urine urgently or frequently. Pain when passing urine. Bowel symptoms, such as: Constipation. Urgent or frequent bowel movements. Incomplete bowel movements. Painful bowel movements. Leaking stool or gas. Unexplained genital or rectal pain. Genital or rectal muscle spasms. Low back pain. Other symptoms may include: A heavy, full, or aching feeling in the vagina. A bulge that protrudes into the vagina. Pain during or after sex. How is this diagnosed? This condition may be diagnosed based on: Your symptoms and medical history. A physical exam. During the exam, your health care provider may check your pelvic muscles for tightness, spasm, pain, or weakness. This may include a rectal exam and a pelvic exam. In some cases, you may have diagnostic tests, such as: Electrical muscle function tests. Urine flow testing. X-ray tests of bowel function. Ultrasound of the pelvic organs. How is this treated? Treatment for this condition depends on the symptoms. Treatment options include: Physical therapy. This may include Kegel exercises to help relax or strengthen the pelvic floor muscles. Biofeedback. This type of therapy provides feedback on how tight your pelvic floor muscles are so that you can learn to control them. Internal or external massage therapy. A treatment that involves electrical stimulation of the pelvic floor muscles to help control pain (transcutaneous electrical nerve stimulation,  or TENS). Sound wave therapy (ultrasound) to reduce muscle spasms. Medicines, such as: Muscle relaxants. Bladder control medicines. Surgery to reconstruct or support pelvic floor muscles may be an option if other treatments do not help. Follow these instructions at home: Activity Do your usual activities as told by your health care provider. Ask your health care provider if you should modify any activities. Do pelvic floor strengthening or relaxing exercises at home as told by your physical therapist. Lifestyle Maintain a healthy weight. Eat foods that are high in fiber, such as beans, whole grains, and fresh fruits and vegetables. Limit foods that are high in fat and processed sugars, such as fried or sweet foods. Manage stress with relaxation techniques such as yoga or meditation. General instructions If you have problems with leakage: Use absorbable pads or wear padded underwear. Wash frequently with mild soap. Keep your genital and anal area as clean and dry as possible. Ask your health care provider if you should try a barrier cream to prevent skin irritation. Take warm baths to relieve pelvic muscle tension or spasms. Take over-the-counter and prescription medicines only as told by your health care provider. Keep all follow-up visits. How is this prevented? The cause of PFD is not always known, but there are a few things you can do to reduce the risk of developing this condition, including: Staying at a healthy weight. Getting regular exercise. Managing stress. Contact a health care  provider if: Your symptoms are not improving with home care. You have signs or symptoms of PFD that get worse at home. You develop new signs or symptoms. You have signs of a urinary tract infection, such as: Fever. Chills. Increased urinary frequency. A burning feeling when urinating. You have not had a bowel movement in 3 days (constipation). Summary Pelvic floor dysfunction results when the  muscles and connective tissues in your pelvic floor do not work well. These muscles and their connections form a sling that supports your colon and bladder. In women, they also support the uterus. PFD may be caused by an injury to the pelvic area or by a weakening of pelvic muscles. PFD causes pelvic floor muscles to be too weak, too tight, or a combination of both. Symptoms may vary from person to person. In most cases, PFD can be treated with physical therapies and medicines. Surgery may be an option if other treatments do not help. This information is not intended to replace advice given to you by your health care provider. Make sure you discuss any questions you have with your health care provider. Document Revised: 09/02/2020 Document Reviewed: 09/02/2020 Elsevier Patient Education  2022 [employer].

## 2023-10-19 ENCOUNTER — Ambulatory Visit: Payer: Self-pay | Admitting: Physician Assistant

## 2023-10-19 DIAGNOSIS — K580 Irritable bowel syndrome with diarrhea: Secondary | ICD-10-CM

## 2023-10-27 ENCOUNTER — Ambulatory Visit (HOSPITAL_BASED_OUTPATIENT_CLINIC_OR_DEPARTMENT_OTHER)

## 2023-11-03 ENCOUNTER — Encounter (HOSPITAL_COMMUNITY): Payer: Self-pay

## 2023-11-03 ENCOUNTER — Ambulatory Visit (HOSPITAL_COMMUNITY)
Admission: RE | Admit: 2023-11-03 | Discharge: 2023-11-03 | Disposition: A | Discharge status: Home / Self Care | Source: Ambulatory Visit | Attending: Physician Assistant | Admitting: Physician Assistant

## 2023-11-03 DIAGNOSIS — K56600 Partial intestinal obstruction, unspecified as to cause: Secondary | ICD-10-CM | POA: Diagnosis present

## 2023-11-03 DIAGNOSIS — Z8719 Personal history of other diseases of the digestive system: Secondary | ICD-10-CM | POA: Insufficient documentation

## 2023-11-03 DIAGNOSIS — Z9889 Other specified postprocedural states: Secondary | ICD-10-CM | POA: Diagnosis present

## 2023-11-03 MED ORDER — SODIUM CHLORIDE (PF) 0.9 % IJ SOLN
INTRAMUSCULAR | Status: AC
  Filled 2023-11-03: qty 50

## 2023-11-03 MED ORDER — IOHEXOL 300 MG/ML  SOLN
100.0000 mL | Freq: Once | INTRAMUSCULAR | Status: AC | PRN
  Administered 2023-11-03: 100 mL via INTRAVENOUS

## 2023-11-09 MED ORDER — RIFAXIMIN 550 MG PO TABS
550.0000 mg | ORAL_TABLET | Freq: Three times a day (TID) | ORAL | 0 refills | Status: AC
Start: 2023-11-09 — End: 2023-11-23

## 2023-11-09 MED ORDER — NEOMYCIN SULFATE 500 MG PO TABS: 500.0000 mg | ORAL_TABLET | Freq: Two times a day (BID) | ORAL | 0 refills | Status: AC

## 2023-11-09 NOTE — Telephone Encounter (Signed)
 Patient had partial response to Xifaxan  we will retreat with neomycin.

## 2023-12-29 MED ORDER — NEOMYCIN SULFATE 500 MG PO TABS: 500.0000 mg | ORAL_TABLET | Freq: Two times a day (BID) | ORAL | 0 refills | Status: AC

## 2023-12-29 MED ORDER — RIFAXIMIN 550 MG PO TABS: 550.0000 mg | ORAL_TABLET | Freq: Three times a day (TID) | ORAL | 0 refills | Status: AC

## 2024-02-07 NOTE — Therapy (Unsigned)
 OUTPATIENT PHYSICAL THERAPY FEMALE PELVIC EVALUATION   Patient Name: Alison [name] MRN: [id number] DOB:29-Jul-1975, 48 y.o., female Today's Date: 02/08/2024  END OF SESSION:  PT End of Session - 02/08/24 1602     Visit Number 1    Date for Recertification  08/08/24    Authorization Type BCBS    PT Start Time 1600    PT Stop Time 1645    PT Time Calculation (min) 45 min    Activity Tolerance Patient tolerated treatment well    Behavior During Therapy [facility] for tasks assessed/performed          Past Medical History:  Diagnosis Date   Anxiety    Bacterial overgrowth syndrome 05/09/2010   Bloating 05/09/2010   SEP 2012 HBT C/W SIBO(1ST pk 15, 2nd pk 21)   GERD (gastroesophageal reflux disease)    Hypertension    Pneumonia    PONV (postoperative nausea and vomiting)    Ulcer 12/03/2010   Past Surgical History:  Procedure Laterality Date   CESAREAN SECTION  X2   CHOLECYSTECTOMY  APR 2012 [facility]   BILIARY DYSKINESIA, path: CHRONIC CHOLECYSTITIS   DILATATION AND CURETTAGE/HYSTEROSCOPY WITH MINERVA N/A 08/04/2021   Procedure: DILATATION AND CURETTAGE/HYSTEROSCOPY WITH MINERVA;  Surgeon: Jayne Vonn DEL, MD;  Location: [facility];  Service: Gynecology;  Laterality: N/A;   ESOPHAGOGASTRODUODENOSCOPY  12/03/2010   Fields: PUD, Bx NEGATIVE for H pylori   ESOPHAGOGASTRODUODENOSCOPY  03/15/2011   Fields: mild gastritis, PUD resolved   HYDROGEN BREATH TEST  01/12/2011   Positive SBBO   HYSTERECTOMY ABDOMINAL WITH SALPINGECTOMY Bilateral 06/15/2023   Procedure: HYSTERECTOMY ABDOMINAL WITH SALPINGECTOMY;  Surgeon: Lequita Evalene LABOR, MD;  Location: [facility];  Service: Gynecology;  Laterality: Bilateral;   INCISIONAL HERNIA REPAIR  12/13/2010   Procedure: HERNIA REPAIR INCISIONAL;  Surgeon: Oneil LABOR Budge;  Location: [facility];  Service: General;  Laterality: N/A;  with mesh   INCISIONAL HERNIA REPAIR  01/24/2011   Procedure: HERNIA REPAIR INCISIONAL;  Surgeon: Oneil LABOR Budge;  Location: [facility];  Service: General;  Laterality: N/A;  Recurrent Incisional Herniorraphy with Mesh   INCISIONAL HERNIA REPAIR  2013   [facility]   TUBAL LIGATION     Patient Active Problem List   Diagnosis Date Noted   Pelvic pain 06/15/2023   Elevated BP without diagnosis of hypertension 07/02/2018   Abdominal bloating with cramps 07/02/2018   Irregular intermenstrual bleeding 07/02/2018   Menorrhagia with regular cycle 07/02/2018   Rectocele 07/02/2018   Encounter for well woman exam with routine gynecological exam 07/02/2018   Screening for colorectal cancer 07/02/2018   Constipation 05/07/2014   PUD (peptic ulcer disease) 01/12/2011   Bloating 11/16/2010   Diarrhea 05/22/2010    PCP: Dow Longs, PA-C  REFERRING PROVIDER: Craig Alan SAUNDERS, PA-C   REFERRING DIAG:  M85.9,M89.0 (ICD-10-CM) - Abdominal bloating with cramps  K58.0 (ICD-10-CM) - Irritable bowel syndrome with diarrhea    THERAPY DIAG:  Muscle weakness (generalized) - Plan: PT plan of care cert/re-cert  Other lack of coordination - Plan: PT plan of care cert/re-cert  Abnormal posture - Plan: PT plan of care cert/re-cert  Rationale for Evaluation and Treatment: Rehabilitation  ONSET DATE: 06/2023  SUBJECTIVE:  SUBJECTIVE STATEMENT: I had hysterectomy last February. I have abdominal bloating and had antibiotics. I go form constipation and diarrhea.  Fluid intake: diet pepsi, water   FUNCTIONAL LIMITATIONS: none  PERTINENT HISTORY:  Medications for current condition: none Surgeries: Endometrial ablation 07/08/21; Partial hysterectomy 06/2023; incistional hernia repair 12/2010;  Other: See above.  Sexual abuse: No  PAIN:  Are you having pain? Yes NPRS scale: 3/10 Pain location: low back  Pain type: aching and dull Pain description:  intermittent   Aggravating factors: sit long Relieving factors: heating pad  PRECAUTIONS: None  RED FLAGS: None   WEIGHT BEARING RESTRICTIONS: No  FALLS:  Has patient fallen in last 6 months? No  OCCUPATION: sitting job  ACTIVITY LEVEL : sedentary  PLOF: Independent  PATIENT GOALS: help with bowel problem   BOWEL MOVEMENT: Pain with bowel movement: Yes, 6/10 when constipated Type of bowel movement:Type (Bristol Stool Scale) Type 3 when constipated, Frequency 8-10 per week, Strain sometimes, and Splinting press on the right side of the rectum Fully empty rectum: No, not all the time Leakage: Yes: when she has had diarrhea                                                      Fiber supplement/laxative takes Miralax nightly  URINATION: Pain with urination: No Fully empty bladder: Yes:                               Stream: Strong Urgency: No Frequency:during the day 8                                                         Nocturia: No0-1   Leakage: Walking to the bathroom when she held her urine too long Pads/briefs: No  INTERCOURSE:  Ability to have vaginal penetration Yes  Pain with intercourse: none  PREGNANCY: C-section deliveries 2 Currently pregnant No  PROLAPSE: None   OBJECTIVE:  Note: Objective measures were completed at Evaluation unless otherwise noted.  DIAGNOSTIC FINDINGS:  none   COGNITION: Overall cognitive status: Within functional limits for tasks assessed     SENSATION: Light touch:     POSTURE: rounded shoulders, forward head, and decreased lumbar lordosis   LUMBARAROM/PROM:  A/PROM A/PROM  Eval (% available)  Flexion   Extension   Right lateral flexion   Left lateral flexion   Right rotation   Left rotation    (Blank rows = not tested)  LOWER EXTREMITY ROM: full bilateral hip ROM   LOWER EXTREMITY FFU:apojuzmjo hip strength is 5/5  PALPATION:   Pelvic Alignment: ASIS are in correct alignment  Abdominal: has a  large mesh on the abdomen from hernia surgery  Diastasis: No Distortion: No  Breathing: lifts lower rib cage with breath, difficulty with opening the lower rib cage Scar tissue: Yes: good mobility of the scar                External Perineal Exam: increased ridges around the rectum  Internal Pelvic Floor: puborectalis did not come forward very well but after manual work in moved forward  Patient confirms identification and approves PT to assess internal pelvic floor and treatment Yes No emotional/communication barriers or cognitive limitation. Patient is motivated to learn. Patient understands and agrees with treatment goals and plan. PT explains patient will be examined in standing, sitting, and lying down to see how their muscles and joints work. When they are ready, they will be asked to remove their underwear so PT can examine their perineum. The patient is also given the option of providing their own chaperone as one is not provided in our facility. The patient also has the right and is explained the right to defer or refuse any part of the evaluation or treatment including the internal exam. With the patient's consent, PT will use one gloved finger to gently assess the muscles of the pelvic floor, seeing how well it contracts and relaxes and if there is muscle symmetry. After, the patient will get dressed and PT and patient will discuss exam findings and plan of care. PT and patient discuss plan of care, schedule, attendance policy and HEP activities.   PELVIC MMT:   MMT eval  Internal Anal Sphincter 2/5 but after manual work increased to 3/5  External Anal Sphincter 2/5 but after manual work increased to [employer] 2/5 but after manual work increased to 3/5  (Blank rows = not tested)        TONE: Average tone  PROLAPSE: none  TODAY'S TREATMENT:                                                                                                                               DATE: 02/08/24  EVAL Examination completed, findings reviewed, pt educated on POC, HEP, and female pelvic floor anatomy, reasoning with pelvic floor assessment internally with pt consent. Pt motivated to participate in PT and agreeable to attempt recommendations.     PATIENT EDUCATION:  02/07/14 Education details: Access Code: [email or web address] Person educated: Patient Education method: Explanation, Demonstration, Tactile cues, Verbal cues, and Handouts Education comprehension: verbalized understanding, returned demonstration, verbal cues required, tactile cues required, and needs further education  HOME EXERCISE PROGRAM: 02/08/24 Access Code: [email or web address] URL: [email or web address] Date: 02/08/2024 Prepared by: Channing Pereyra  Exercises - Seated Pelvic Floor Contraction  - 3 x daily - 7 x weekly - 1 sets - 5 reps - 10 sec hold - Seated Quick Flick Pelvic Floor Contractions  - 3 x daily - 7 x weekly - 1 sets - 10 reps  ASSESSMENT:  CLINICAL IMPRESSION: Patient is a 48 y.o. female who was seen today for physical therapy evaluation and treatment for abdominal bloating and IBS. Patient reports issues for awhile. She will type 3 stool when she is constipated and can switch to diarrhea. She will have 6/10 pain rectally when she has pain. Patient will strain at times with constipation. Sometimes she has to push  on the right side of the rectum to have a bowel movement. Her rectal strength was 2/5 but after manual work to the puborectalis and anterior rectum it increased to 3/5. At first she was not able to push the therapist finger out of the rectum but with verbal cues to breath out differently she was able to push the therapist finger out of the rectum.  Patient will benefit from skilled therapy to work on pelvic floor coordination to assist with pushing stool out.   OBJECTIVE IMPAIRMENTS: decreased activity tolerance, decreased coordination, decreased strength, and pain.    ACTIVITY LIMITATIONS: toileting  PARTICIPATION LIMITATIONS: occupation  PERSONAL FACTORS: Time since onset of injury/illness/exacerbation are also affecting patient's functional outcome.   REHAB POTENTIAL: Excellent  CLINICAL DECISION MAKING: Evolving/moderate complexity  EVALUATION COMPLEXITY: Moderate   GOALS: Goals reviewed with patient? Yes  SHORT TERM GOALS: Target date: 03/07/24  Patient instructed on sitting on commode to have a bowel movement.  Baseline: Goal status: INITIAL  2.  Patient is able to demonstrate diaphragmatic breathing to assist with pushing stool out.  Baseline:  Goal status: INITIAL  3.  Patient educated on abdominal massage to assist with moving the stool through the intestines.  Baseline:  Goal status: INITIAL   LONG TERM GOALS: Target date: 08/08/24  Patient independent with advanced HEP for core and pelvic floor.  Baseline:  Goal status: INITIAL  2.  Patient is able to have a bowel movement every 3 days or less due to the improved coordination of the pelvic floor and using the abdominal massage.  Baseline:  Goal status: INITIAL  3.  Patient is able to push the therapist finger out of the rectum so she is able to push stool out of rectum without pressing on the right side of the rectum.  Baseline:  Goal status: INITIAL   PLAN:  PT FREQUENCY: 1x/week  PT DURATION: 6 months  PLANNED INTERVENTIONS: 97110-Therapeutic exercises, 97530- Therapeutic activity, 97112- Neuromuscular re-education, 97535- Self Care, [id number]- Manual therapy, Patient/Family education, Spinal mobilization, and Biofeedback  PLAN FOR NEXT SESSION: educate on posture, work on abdominal massage, diaphragmatic breathing   Channing Pereyra, PT 02/08/24 4:56 PM

## 2024-02-08 ENCOUNTER — Encounter: Payer: Self-pay | Admitting: Physical Therapy

## 2024-02-08 ENCOUNTER — Encounter: Attending: Physician Assistant | Admitting: Physical Therapy

## 2024-02-08 ENCOUNTER — Other Ambulatory Visit: Payer: Self-pay

## 2024-02-08 DIAGNOSIS — R278 Other lack of coordination: Secondary | ICD-10-CM | POA: Diagnosis present

## 2024-02-08 DIAGNOSIS — R293 Abnormal posture: Secondary | ICD-10-CM | POA: Diagnosis present

## 2024-02-08 DIAGNOSIS — M6281 Muscle weakness (generalized): Secondary | ICD-10-CM | POA: Insufficient documentation

## 2024-02-15 ENCOUNTER — Encounter: Admitting: Physical Therapy

## 2024-02-15 ENCOUNTER — Encounter: Payer: Self-pay | Admitting: Physical Therapy

## 2024-02-15 DIAGNOSIS — R293 Abnormal posture: Secondary | ICD-10-CM

## 2024-02-15 DIAGNOSIS — M6281 Muscle weakness (generalized): Secondary | ICD-10-CM | POA: Diagnosis not present

## 2024-02-15 DIAGNOSIS — R278 Other lack of coordination: Secondary | ICD-10-CM

## 2024-02-15 NOTE — Patient Instructions (Signed)

## 2024-02-15 NOTE — Therapy (Signed)
 OUTPATIENT PHYSICAL THERAPY FEMALE PELVIC TREATMENT   Patient Name: Alison [name] MRN: [id number] DOB:1975/11/30, 48 y.o., female Today's Date: 02/15/2024  END OF SESSION:  PT End of Session - 02/15/24 1601     Visit Number 2    Date for Recertification  08/08/24    Authorization Type [employer]    PT Start Time 1600    PT Stop Time 1645    PT Time Calculation (min) 45 min    Activity Tolerance Patient tolerated treatment well    Behavior During Therapy [facility] for tasks assessed/performed          Past Medical History:  Diagnosis Date   Anxiety    Bacterial overgrowth syndrome 05/09/2010   Bloating 05/09/2010   SEP 2012 HBT C/W SIBO(1ST pk 15, 2nd pk 21)   GERD (gastroesophageal reflux disease)    Hypertension    Pneumonia    PONV (postoperative nausea and vomiting)    Ulcer 12/03/2010   Past Surgical History:  Procedure Laterality Date   CESAREAN SECTION  X2   CHOLECYSTECTOMY  APR 2012 [facility]   BILIARY DYSKINESIA, path: CHRONIC CHOLECYSTITIS   DILATATION AND CURETTAGE/HYSTEROSCOPY WITH MINERVA N/A 08/04/2021   Procedure: DILATATION AND CURETTAGE/HYSTEROSCOPY WITH MINERVA;  Surgeon: Jayne Vonn DEL, MD;  Location: [facility];  Service: Gynecology;  Laterality: N/A;   ESOPHAGOGASTRODUODENOSCOPY  12/03/2010   Fields: PUD, Bx NEGATIVE for H pylori   ESOPHAGOGASTRODUODENOSCOPY  03/15/2011   Fields: mild gastritis, PUD resolved   HYDROGEN BREATH TEST  01/12/2011   Positive SBBO   HYSTERECTOMY ABDOMINAL WITH SALPINGECTOMY Bilateral 06/15/2023   Procedure: HYSTERECTOMY ABDOMINAL WITH SALPINGECTOMY;  Surgeon: Lequita Evalene LABOR, MD;  Location: [facility];  Service: Gynecology;  Laterality: Bilateral;   INCISIONAL HERNIA REPAIR  12/13/2010   Procedure: HERNIA REPAIR INCISIONAL;  Surgeon: Oneil LABOR Budge;  Location: [facility];  Service: General;  Laterality: N/A;  with mesh   INCISIONAL HERNIA REPAIR  01/24/2011   Procedure: HERNIA REPAIR INCISIONAL;  Surgeon: Oneil LABOR Budge;  Location: [facility];  Service: General;  Laterality: N/A;  Recurrent Incisional Herniorraphy with Mesh   INCISIONAL HERNIA REPAIR  2013   [facility]   TUBAL LIGATION     Patient Active Problem List   Diagnosis Date Noted   Pelvic pain 06/15/2023   Elevated BP without diagnosis of hypertension 07/02/2018   Abdominal bloating with cramps 07/02/2018   Irregular intermenstrual bleeding 07/02/2018   Menorrhagia with regular cycle 07/02/2018   Rectocele 07/02/2018   Encounter for well woman exam with routine gynecological exam 07/02/2018   Screening for colorectal cancer 07/02/2018   Constipation 05/07/2014   PUD (peptic ulcer disease) 01/12/2011   Bloating 11/16/2010   Diarrhea 05/22/2010    PCP: Dow Longs, PA-C  REFERRING PROVIDER: Craig Alan SAUNDERS, PA-C   REFERRING DIAG:  M85.9,M89.0 (ICD-10-CM) - Abdominal bloating with cramps  K58.0 (ICD-10-CM) - Irritable bowel syndrome with diarrhea    THERAPY DIAG:  Muscle weakness (generalized)  Other lack of coordination  Abnormal posture  Rationale for Evaluation and Treatment: Rehabilitation  ONSET DATE: 06/2023  SUBJECTIVE:  SUBJECTIVE STATEMENT: I was sore after treatment.   Fluid intake: diet pepsi, water   FUNCTIONAL LIMITATIONS: none  PERTINENT HISTORY:  Medications for current condition: none Surgeries: Endometrial ablation 07/08/21; Partial hysterectomy 06/2023; incistional hernia repair 12/2010;  Other: See above.  Sexual abuse: No  PAIN:  Are you having pain? Yes NPRS scale: 3/10 Pain location: low back  Pain type: aching and dull Pain description: intermittent   Aggravating factors: sit long Relieving factors: heating pad  PRECAUTIONS: None  RED FLAGS: None   WEIGHT BEARING RESTRICTIONS: No  FALLS:  Has patient fallen in last 6 months?  No  OCCUPATION: sitting job  ACTIVITY LEVEL : sedentary  PLOF: Independent  PATIENT GOALS: help with bowel problem   BOWEL MOVEMENT: Pain with bowel movement: Yes, 6/10 when constipated Type of bowel movement:Type (Bristol Stool Scale) Type 3 when constipated, Frequency 8-10 per week, Strain sometimes, and Splinting press on the right side of the rectum Fully empty rectum: No, not all the time Leakage: Yes: when she has had diarrhea                                                      Fiber supplement/laxative takes Miralax nightly  URINATION: Pain with urination: No Fully empty bladder: Yes:                               Stream: Strong Urgency: No Frequency:during the day 8                                                         Nocturia: No0-1   Leakage: Walking to the bathroom when she held her urine too long Pads/briefs: No  INTERCOURSE:  Ability to have vaginal penetration Yes  Pain with intercourse: none  PREGNANCY: C-section deliveries 2 Currently pregnant No  PROLAPSE: None   OBJECTIVE:  Note: Objective measures were completed at Evaluation unless otherwise noted.  DIAGNOSTIC FINDINGS:  none   COGNITION: Overall cognitive status: Within functional limits for tasks assessed     SENSATION: Light touch:     POSTURE: rounded shoulders, forward head, and decreased lumbar lordosis   LUMBARAROM/PROM:  A/PROM A/PROM  Eval (% available)  Flexion   Extension   Right lateral flexion   Left lateral flexion   Right rotation   Left rotation    (Blank rows = not tested)  LOWER EXTREMITY ROM: full bilateral hip ROM   LOWER EXTREMITY FFU:apojuzmjo hip strength is 5/5  PALPATION:   Pelvic Alignment: ASIS are in correct alignment  Abdominal: has a large mesh on the abdomen from hernia surgery  Diastasis: No Distortion: No  Breathing: lifts lower rib cage with breath, difficulty with opening the lower rib cage Scar tissue: Yes: good mobility of  the scar                External Perineal Exam: increased ridges around the rectum                             Internal Pelvic Floor: puborectalis did  not come forward very well but after manual work in moved forward  Patient confirms identification and approves PT to assess internal pelvic floor and treatment Yes No emotional/communication barriers or cognitive limitation. Patient is motivated to learn. Patient understands and agrees with treatment goals and plan. PT explains patient will be examined in standing, sitting, and lying down to see how their muscles and joints work. When they are ready, they will be asked to remove their underwear so PT can examine their perineum. The patient is also given the option of providing their own chaperone as one is not provided in our facility. The patient also has the right and is explained the right to defer or refuse any part of the evaluation or treatment including the internal exam. With the patient's consent, PT will use one gloved finger to gently assess the muscles of the pelvic floor, seeing how well it contracts and relaxes and if there is muscle symmetry. After, the patient will get dressed and PT and patient will discuss exam findings and plan of care. PT and patient discuss plan of care, schedule, attendance policy and HEP activities.   PELVIC MMT:   MMT eval  Internal Anal Sphincter 2/5 but after manual work increased to 3/5  External Anal Sphincter 2/5 but after manual work increased to [employer] 2/5 but after manual work increased to 3/5  (Blank rows = not tested)        TONE: Average tone  PROLAPSE: none  TODAY'S TREATMENT:      02/15/24 Manual: Soft tissue mobilization: Circular massage to the abdomen to promote peristalic motion of the intestines Manual work to the diaphragm Scar tissue mobilization: Manual work on lower abdominal scar to reduce the restrictions Myofascial release: Fascial release of the lower  quadrants Neuromuscular re-education: Core retraining: Supine hip flexion isometric to engage the lower abdomen 10 x  Supine diagonal hip flexion isometric to engage the obliques 10 x  Standing bilateral shoulder extension with red band 15 x Standing shoulder horizontal abduction with red band 15 x Diaphragmatic breathing in supine and sitting Therapeutic activities: Functional strengthening activities: Educated patient on how to sit on the commode to have a bowel movement, diaphragmatic breathing to relax the pelvic floor, and how to breath out to push stool out                                                                                                                            DATE: 02/08/24  EVAL Examination completed, findings reviewed, pt educated on POC, HEP, and female pelvic floor anatomy, reasoning with pelvic floor assessment internally with pt consent. Pt motivated to participate in PT and agreeable to attempt recommendations.     PATIENT EDUCATION:  02/14/14 Education details: Access Code: [email or web address]; how to have a bowel movement Person educated: Patient Education method: Explanation, Demonstration, Tactile cues, Verbal cues, and Handouts Education comprehension: verbalized understanding, returned demonstration, verbal cues required, tactile cues required, and  needs further education  HOME EXERCISE PROGRAM: 02/15/24 Access Code: [email or web address] URL: [email or web address] Date: 02/15/2024 Prepared by: Channing Pereyra  Exercises - Seated Pelvic Floor Contraction  - 3 x daily - 7 x weekly - 1 sets - 5 reps - 10 sec hold - Seated Quick Flick Pelvic Floor Contractions  - 3 x daily - 7 x weekly - 1 sets - 10 reps - Hooklying Isometric Hip Flexion  - 1 x daily - 7 x weekly - 1 sets - 10 reps - Hooklying Isometric Hip Flexion with Opposite Arm  - 1 x daily - 7 x weekly - 1 sets - 10 reps - Shoulder extension with resistance - Neutral  - 1 x daily - 7 x weekly - 2 sets - 10  reps - Standing Shoulder Horizontal Abduction with Anchored Resistance  - 1 x daily - 7 x weekly - 1-2 sets - 10 reps  Patient Education - Abdominal Massage for Constipation  ASSESSMENT:  CLINICAL IMPRESSION: Patient is a 48 y.o. female who was seen today for physical therapy  treatment for abdominal bloating and IBS.Patient was educated on ways to have a bowel movement to reduce straining with a bowel movement. She was able to perform diaphragmatic breathing and feel the pelvic floor relax. She is working on the trunk extensors to improve her posture.   Patient will benefit from skilled therapy to work on pelvic floor coordination to assist with pushing stool out.   OBJECTIVE IMPAIRMENTS: decreased activity tolerance, decreased coordination, decreased strength, and pain.   ACTIVITY LIMITATIONS: toileting  PARTICIPATION LIMITATIONS: occupation  PERSONAL FACTORS: Time since onset of injury/illness/exacerbation are also affecting patient's functional outcome.   REHAB POTENTIAL: Excellent  CLINICAL DECISION MAKING: Evolving/moderate complexity  EVALUATION COMPLEXITY: Moderate   GOALS: Goals reviewed with patient? Yes  SHORT TERM GOALS: Target date: 03/07/24  Patient instructed on sitting on commode to have a bowel movement.  Baseline: Goal status: INITIAL  2.  Patient is able to demonstrate diaphragmatic breathing to assist with pushing stool out.  Baseline:  Goal status: Met 02/15/24  3.  Patient educated on abdominal massage to assist with moving the stool through the intestines.  Baseline:  Goal status: Met 02/15/24   LONG TERM GOALS: Target date: 08/08/24  Patient independent with advanced HEP for core and pelvic floor.  Baseline:  Goal status: INITIAL  2.  Patient is able to have a bowel movement every 3 days or less due to the improved coordination of the pelvic floor and using the abdominal massage.  Baseline:  Goal status: INITIAL  3.  Patient is able to push  the therapist finger out of the rectum so she is able to push stool out of rectum without pressing on the right side of the rectum.  Baseline:  Goal status: INITIAL   PLAN:  PT FREQUENCY: 1x/week  PT DURATION: 6 months  PLANNED INTERVENTIONS: 97110-Therapeutic exercises, 97530- Therapeutic activity, 97112- Neuromuscular re-education, 97535- Self Care, [id number]- Manual therapy, Patient/Family education, Spinal mobilization, and Biofeedback  PLAN FOR NEXT SESSION:  continue with core and postural muscle strength  Channing Pereyra, PT 02/15/24 4:45 PM

## 2024-02-22 ENCOUNTER — Encounter: Payer: Self-pay | Admitting: Physical Therapy

## 2024-02-22 ENCOUNTER — Encounter: Admitting: Physical Therapy

## 2024-02-22 DIAGNOSIS — R278 Other lack of coordination: Secondary | ICD-10-CM

## 2024-02-22 DIAGNOSIS — R293 Abnormal posture: Secondary | ICD-10-CM

## 2024-02-22 DIAGNOSIS — M6281 Muscle weakness (generalized): Secondary | ICD-10-CM

## 2024-02-22 NOTE — Therapy (Signed)
 OUTPATIENT PHYSICAL THERAPY FEMALE PELVIC TREATMENT   Patient Name: Alison [name] MRN: [id number] DOB:Jul 30, 1975, 48 y.o., female Today's Date: 02/22/2024  END OF SESSION:  PT End of Session - 02/22/24 1601     Visit Number 3    Date for Recertification  08/08/24    Authorization Type [employer]    PT Start Time 1600    PT Stop Time 1645    PT Time Calculation (min) 45 min    Activity Tolerance Patient tolerated treatment well    Behavior During Therapy [facility] for tasks assessed/performed          Past Medical History:  Diagnosis Date   Anxiety    Bacterial overgrowth syndrome 05/09/2010   Bloating 05/09/2010   SEP 2012 HBT C/W SIBO(1ST pk 15, 2nd pk 21)   GERD (gastroesophageal reflux disease)    Hypertension    Pneumonia    PONV (postoperative nausea and vomiting)    Ulcer 12/03/2010   Past Surgical History:  Procedure Laterality Date   CESAREAN SECTION  X2   CHOLECYSTECTOMY  APR 2012 [facility]   BILIARY DYSKINESIA, path: CHRONIC CHOLECYSTITIS   DILATATION AND CURETTAGE/HYSTEROSCOPY WITH MINERVA N/A 08/04/2021   Procedure: DILATATION AND CURETTAGE/HYSTEROSCOPY WITH MINERVA;  Surgeon: Jayne Vonn DEL, MD;  Location: [facility];  Service: Gynecology;  Laterality: N/A;   ESOPHAGOGASTRODUODENOSCOPY  12/03/2010   Fields: PUD, Bx NEGATIVE for H pylori   ESOPHAGOGASTRODUODENOSCOPY  03/15/2011   Fields: mild gastritis, PUD resolved   HYDROGEN BREATH TEST  01/12/2011   Positive SBBO   HYSTERECTOMY ABDOMINAL WITH SALPINGECTOMY Bilateral 06/15/2023   Procedure: HYSTERECTOMY ABDOMINAL WITH SALPINGECTOMY;  Surgeon: Lequita Evalene LABOR, MD;  Location: [facility];  Service: Gynecology;  Laterality: Bilateral;   INCISIONAL HERNIA REPAIR  12/13/2010   Procedure: HERNIA REPAIR INCISIONAL;  Surgeon: Oneil LABOR Budge;  Location: [facility];  Service: General;  Laterality: N/A;  with mesh   INCISIONAL HERNIA REPAIR  01/24/2011   Procedure: HERNIA REPAIR INCISIONAL;  Surgeon: Oneil LABOR Budge;  Location: [facility];  Service: General;  Laterality: N/A;  Recurrent Incisional Herniorraphy with Mesh   INCISIONAL HERNIA REPAIR  2013   [facility]   TUBAL LIGATION     Patient Active Problem List   Diagnosis Date Noted   Pelvic pain 06/15/2023   Elevated BP without diagnosis of hypertension 07/02/2018   Abdominal bloating with cramps 07/02/2018   Irregular intermenstrual bleeding 07/02/2018   Menorrhagia with regular cycle 07/02/2018   Rectocele 07/02/2018   Encounter for well woman exam with routine gynecological exam 07/02/2018   Screening for colorectal cancer 07/02/2018   Constipation 05/07/2014   PUD (peptic ulcer disease) 01/12/2011   Bloating 11/16/2010   Diarrhea 05/22/2010    PCP: Dow Longs, PA-C  REFERRING PROVIDER: Craig Alan SAUNDERS, PA-C   REFERRING DIAG:  M85.9,M89.0 (ICD-10-CM) - Abdominal bloating with cramps  K58.0 (ICD-10-CM) - Irritable bowel syndrome with diarrhea    THERAPY DIAG:  Muscle weakness (generalized)  Other lack of coordination  Abnormal posture  Rationale for Evaluation and Treatment: Rehabilitation  ONSET DATE: 06/2023  SUBJECTIVE:  SUBJECTIVE STATEMENT: I felt good after last visit.  Fluid intake: diet pepsi, water   FUNCTIONAL LIMITATIONS: none  PERTINENT HISTORY:  Medications for current condition: none Surgeries: Endometrial ablation 07/08/21; Partial hysterectomy 06/2023; incistional hernia repair 12/2010;  Other: See above.  Sexual abuse: No  PAIN:  Are you having pain? Yes NPRS scale: 3/10 Pain location: low back  Pain type: aching and dull Pain description: intermittent   Aggravating factors: sit long Relieving factors: heating pad  PRECAUTIONS: None  RED FLAGS: None   WEIGHT BEARING RESTRICTIONS: No  FALLS:  Has patient fallen in last 6 months?  No  OCCUPATION: sitting job  ACTIVITY LEVEL : sedentary  PLOF: Independent  PATIENT GOALS: help with bowel problem   BOWEL MOVEMENT: Pain with bowel movement: Yes, 6/10 when constipated Type of bowel movement:Type (Bristol Stool Scale) Type 3 when constipated, Frequency 8-10 per week, Strain sometimes, and Splinting press on the right side of the rectum Fully empty rectum: No, not all the time Leakage: Yes: when she has had diarrhea                                                      Fiber supplement/laxative takes Miralax nightly  URINATION: Pain with urination: No Fully empty bladder: Yes:                               Stream: Strong Urgency: No Frequency:during the day 8                                                         Nocturia: No0-1   Leakage: Walking to the bathroom when she held her urine too long Pads/briefs: No  INTERCOURSE:  Ability to have vaginal penetration Yes  Pain with intercourse: none  PREGNANCY: C-section deliveries 2 Currently pregnant No  PROLAPSE: None   OBJECTIVE:  Note: Objective measures were completed at Evaluation unless otherwise noted.  DIAGNOSTIC FINDINGS:  none   COGNITION: Overall cognitive status: Within functional limits for tasks assessed     SENSATION: Light touch:     POSTURE: rounded shoulders, forward head, and decreased lumbar lordosis   LUMBARAROM/PROM:  A/PROM A/PROM  Eval (% available)  Flexion   Extension   Right lateral flexion   Left lateral flexion   Right rotation   Left rotation    (Blank rows = not tested)  LOWER EXTREMITY ROM: full bilateral hip ROM   LOWER EXTREMITY FFU:apojuzmjo hip strength is 5/5  PALPATION:   Pelvic Alignment: ASIS are in correct alignment  Abdominal: has a large mesh on the abdomen from hernia surgery  Diastasis: No Distortion: No  Breathing: lifts lower rib cage with breath, difficulty with opening the lower rib cage Scar tissue: Yes: good mobility of  the scar                External Perineal Exam: increased ridges around the rectum                             Internal Pelvic Floor: puborectalis did  not come forward very well but after manual work in moved forward  Patient confirms identification and approves PT to assess internal pelvic floor and treatment Yes No emotional/communication barriers or cognitive limitation. Patient is motivated to learn. Patient understands and agrees with treatment goals and plan. PT explains patient will be examined in standing, sitting, and lying down to see how their muscles and joints work. When they are ready, they will be asked to remove their underwear so PT can examine their perineum. The patient is also given the option of providing their own chaperone as one is not provided in our facility. The patient also has the right and is explained the right to defer or refuse any part of the evaluation or treatment including the internal exam. With the patient's consent, PT will use one gloved finger to gently assess the muscles of the pelvic floor, seeing how well it contracts and relaxes and if there is muscle symmetry. After, the patient will get dressed and PT and patient will discuss exam findings and plan of care. PT and patient discuss plan of care, schedule, attendance policy and HEP activities.   PELVIC MMT:   MMT eval  Internal Anal Sphincter 2/5 but after manual work increased to 3/5  External Anal Sphincter 2/5 but after manual work increased to [employer] 2/5 but after manual work increased to 3/5  (Blank rows = not tested)        TONE: Average tone  PROLAPSE: none  TODAY'S TREATMENT:      02/22/24 Neuromuscular re-education: Core retraining: Supine pulling band to side to work the obliques with ball squeeze 15 x each way Sup[ine pulling band down  to hips with ball squeeze 15 x Supine press ball into the same side knee and flex the other hip and shoulder 15 x each Bridge 15 x   Side plank 10 x each side Laying on side and press hand into mat and contract the abdominals Quadruped lift opposite arm and leg    02/15/24 Manual: Soft tissue mobilization: Circular massage to the abdomen to promote peristalic motion of the intestines Manual work to the diaphragm Scar tissue mobilization: Manual work on lower abdominal scar to reduce the restrictions Myofascial release: Fascial release of the lower quadrants Neuromuscular re-education: Core retraining: Supine hip flexion isometric to engage the lower abdomen 10 x  Supine diagonal hip flexion isometric to engage the obliques 10 x  Standing bilateral shoulder extension with red band 15 x Standing shoulder horizontal abduction with red band 15 x Diaphragmatic breathing in supine and sitting Therapeutic activities: Functional strengthening activities: Educated patient on how to sit on the commode to have a bowel movement, diaphragmatic breathing to relax the pelvic floor, and how to breath out to push stool out                                                                                                                            DATE:  02/08/24  EVAL Examination completed, findings reviewed, pt educated on POC, HEP, and female pelvic floor anatomy, reasoning with pelvic floor assessment internally with pt consent. Pt motivated to participate in PT and agreeable to attempt recommendations.     PATIENT EDUCATION:  02/21/14 Education details: Access Code: [email or web address]; how to have a bowel movement Person educated: Patient Education method: Explanation, Demonstration, Tactile cues, Verbal cues, and Handouts Education comprehension: verbalized understanding, returned demonstration, verbal cues required, tactile cues required, and needs further education  HOME EXERCISE PROGRAM: 02/22/24 Access Code: [email or web address] URL: [email or web address] Date: 02/22/2024 Prepared by: Channing Pereyra  Exercises - Seated  Pelvic Floor Contraction  - 3 x daily - 7 x weekly - 1 sets - 5 reps - 10 sec hold - Seated Quick Flick Pelvic Floor Contractions  - 3 x daily - 7 x weekly - 1 sets - 10 reps - Shoulder extension with resistance - Neutral  - 1 x daily - 3 x weekly - 2 sets - 10 reps - Standing Shoulder Horizontal Abduction with Anchored Resistance  - 1 x daily - 3 x weekly - 1-2 sets - 10 reps - Supine Bridge  - 1 x daily - 3 x weekly - 1 sets - 15 reps - Hooklying Anti-Rotation Press With Anchored Resistance  - 1 x daily - 7 x weekly - 3 sets - 10 reps - Dead Bug with Swiss Ball  - 1 x daily - 3 x weekly - 3 sets - 10 reps - Side Plank on Knees  - 1 x daily - 3 x weekly - 1 sets - 10 reps - Supine 90/90 Shoulder Extension with Resistance  - 1 x daily - 3 x weekly - 1 sets - 10 reps - Quadruped Pelvic Floor Contraction with Opposite Arm and Leg Lift  - 1 x daily - 3 x weekly - 1 sets - 10 reps  Patient Education - Abdominal Massage for Constipation ASSESSMENT:  CLINICAL IMPRESSION: Patient is a 48 y.o. female who was seen today for physical therapy  treatment for abdominal bloating and IBS. Patient leaked a little with a sneeze. Pateint had several days of constipation.  She is learning how to exercise her core to build strength to push stool out.  She is doing her abdominal massage. Patient will benefit from skilled therapy to work on pelvic floor coordination to assist with pushing stool out.   OBJECTIVE IMPAIRMENTS: decreased activity tolerance, decreased coordination, decreased strength, and pain.   ACTIVITY LIMITATIONS: toileting  PARTICIPATION LIMITATIONS: occupation  PERSONAL FACTORS: Time since onset of injury/illness/exacerbation are also affecting patient's functional outcome.   REHAB POTENTIAL: Excellent  CLINICAL DECISION MAKING: Evolving/moderate complexity  EVALUATION COMPLEXITY: Moderate   GOALS: Goals reviewed with patient? Yes  SHORT TERM GOALS: Target date: 03/07/24  Patient  instructed on sitting on commode to have a bowel movement.  Baseline: Goal status: Met 02/22/24  2.  Patient is able to demonstrate diaphragmatic breathing to assist with pushing stool out.  Baseline:  Goal status: Met 02/15/24  3.  Patient educated on abdominal massage to assist with moving the stool through the intestines.  Baseline:  Goal status: Met 02/15/24   LONG TERM GOALS: Target date: 08/08/24  Patient independent with advanced HEP for core and pelvic floor.  Baseline:  Goal status: INITIAL  2.  Patient is able to have a bowel movement every 3 days or less due to the improved coordination of the pelvic floor and using the abdominal massage.  Baseline:  Goal status: INITIAL  3.  Patient is able to push the therapist finger out of the rectum so she is able to push stool out of rectum without pressing on the right side of the rectum.  Baseline:  Goal status: INITIAL   PLAN:  PT FREQUENCY: 1x/week  PT DURATION: 6 months  PLANNED INTERVENTIONS: 97110-Therapeutic exercises, 97530- Therapeutic activity, 97112- Neuromuscular re-education, 97535- Self Care, [id number]- Manual therapy, Patient/Family education, Spinal mobilization, and Biofeedback  PLAN FOR NEXT SESSION:  continue with core and postural muscle strength  Channing Pereyra, PT 02/22/24 4:54 PM

## 2024-02-29 ENCOUNTER — Encounter: Admitting: Physical Therapy

## 2024-03-05 ENCOUNTER — Encounter: Admitting: Physical Therapy

## 2024-03-05 ENCOUNTER — Encounter: Payer: Self-pay | Admitting: Physical Therapy

## 2024-03-05 DIAGNOSIS — M6281 Muscle weakness (generalized): Secondary | ICD-10-CM | POA: Diagnosis not present

## 2024-03-05 DIAGNOSIS — R293 Abnormal posture: Secondary | ICD-10-CM

## 2024-03-05 DIAGNOSIS — R278 Other lack of coordination: Secondary | ICD-10-CM

## 2024-03-05 NOTE — Therapy (Signed)
 OUTPATIENT PHYSICAL THERAPY FEMALE PELVIC TREATMENT   Patient Name: Alison [name] MRN: [id number] DOB:01-07-1976, 48 y.o., female Today's Date: 03/05/2024  END OF SESSION:  PT End of Session - 03/05/24 1504     Visit Number 4    Date for Recertification  08/08/24    Authorization Type [employer]    PT Start Time 1500    PT Stop Time 1545    PT Time Calculation (min) 45 min    Activity Tolerance Patient tolerated treatment well    Behavior During Therapy [facility] for tasks assessed/performed          Past Medical History:  Diagnosis Date   Anxiety    Bacterial overgrowth syndrome 05/09/2010   Bloating 05/09/2010   SEP 2012 HBT C/W SIBO(1ST pk 15, 2nd pk 21)   GERD (gastroesophageal reflux disease)    Hypertension    Pneumonia    PONV (postoperative nausea and vomiting)    Ulcer 12/03/2010   Past Surgical History:  Procedure Laterality Date   CESAREAN SECTION  X2   CHOLECYSTECTOMY  APR 2012 [facility]   BILIARY DYSKINESIA, path: CHRONIC CHOLECYSTITIS   DILATATION AND CURETTAGE/HYSTEROSCOPY WITH MINERVA N/A 08/04/2021   Procedure: DILATATION AND CURETTAGE/HYSTEROSCOPY WITH MINERVA;  Surgeon: Jayne Vonn DEL, MD;  Location: [facility];  Service: Gynecology;  Laterality: N/A;   ESOPHAGOGASTRODUODENOSCOPY  12/03/2010   Fields: PUD, Bx NEGATIVE for H pylori   ESOPHAGOGASTRODUODENOSCOPY  03/15/2011   Fields: mild gastritis, PUD resolved   HYDROGEN BREATH TEST  01/12/2011   Positive SBBO   HYSTERECTOMY ABDOMINAL WITH SALPINGECTOMY Bilateral 06/15/2023   Procedure: HYSTERECTOMY ABDOMINAL WITH SALPINGECTOMY;  Surgeon: Lequita Evalene LABOR, MD;  Location: [facility];  Service: Gynecology;  Laterality: Bilateral;   INCISIONAL HERNIA REPAIR  12/13/2010   Procedure: HERNIA REPAIR INCISIONAL;  Surgeon: Oneil LABOR Budge;  Location: [facility];  Service: General;  Laterality: N/A;  with mesh   INCISIONAL HERNIA REPAIR  01/24/2011   Procedure: HERNIA REPAIR INCISIONAL;  Surgeon: Oneil LABOR Budge;  Location: [facility];  Service: General;  Laterality: N/A;  Recurrent Incisional Herniorraphy with Mesh   INCISIONAL HERNIA REPAIR  2013   [facility]   TUBAL LIGATION     Patient Active Problem List   Diagnosis Date Noted   Pelvic pain 06/15/2023   Elevated BP without diagnosis of hypertension 07/02/2018   Abdominal bloating with cramps 07/02/2018   Irregular intermenstrual bleeding 07/02/2018   Menorrhagia with regular cycle 07/02/2018   Rectocele 07/02/2018   Encounter for well woman exam with routine gynecological exam 07/02/2018   Screening for colorectal cancer 07/02/2018   Constipation 05/07/2014   PUD (peptic ulcer disease) 01/12/2011   Bloating 11/16/2010   Diarrhea 05/22/2010    PCP: Dow Longs, PA-C  REFERRING PROVIDER: Craig Alan SAUNDERS, PA-C   REFERRING DIAG:  M85.9,M89.0 (ICD-10-CM) - Abdominal bloating with cramps  K58.0 (ICD-10-CM) - Irritable bowel syndrome with diarrhea    THERAPY DIAG:  Muscle weakness (generalized)  Other lack of coordination  Abnormal posture  Rationale for Evaluation and Treatment: Rehabilitation  ONSET DATE: 06/2023  SUBJECTIVE:  SUBJECTIVE STATEMENT: I have been doing the exercises. I am feeling stronger.  Fluid intake: diet pepsi, water   FUNCTIONAL LIMITATIONS: none  PERTINENT HISTORY:  Medications for current condition: none Surgeries: Endometrial ablation 07/08/21; Partial hysterectomy 06/2023; incistional hernia repair 12/2010;  Other: See above.  Sexual abuse: No  PAIN:  Are you having pain? Yes NPRS scale: 3/10 Pain location: low back  Pain type: aching and dull Pain description: intermittent   Aggravating factors: sit long Relieving factors: heating pad  PRECAUTIONS: None  RED FLAGS: None   WEIGHT BEARING RESTRICTIONS: No  FALLS:  Has  patient fallen in last 6 months? No  OCCUPATION: sitting job  ACTIVITY LEVEL : sedentary  PLOF: Independent  PATIENT GOALS: help with bowel problem   BOWEL MOVEMENT: Pain with bowel movement: Yes, 6/10 when constipated Type of bowel movement:Type (Bristol Stool Scale) Type 3 when constipated, Frequency 8-10 per week, Strain sometimes, and Splinting press on the right side of the rectum Fully empty rectum: No, not all the time Leakage: Yes: when she has had diarrhea                                                      Fiber supplement/laxative takes Miralax nightly  URINATION: Pain with urination: No Fully empty bladder: Yes:                               Stream: Strong Urgency: No Frequency:during the day 8                                                         Nocturia: No0-1   Leakage: Walking to the bathroom when she held her urine too long Pads/briefs: No  INTERCOURSE:  Ability to have vaginal penetration Yes  Pain with intercourse: none  PREGNANCY: C-section deliveries 2 Currently pregnant No  PROLAPSE: None   OBJECTIVE:  Note: Objective measures were completed at Evaluation unless otherwise noted.  DIAGNOSTIC FINDINGS:  none   COGNITION: Overall cognitive status: Within functional limits for tasks assessed     SENSATION: Light touch:     POSTURE: rounded shoulders, forward head, and decreased lumbar lordosis   LUMBARAROM/PROM:  A/PROM A/PROM  Eval (% available)  Flexion   Extension   Right lateral flexion   Left lateral flexion   Right rotation   Left rotation    (Blank rows = not tested)  LOWER EXTREMITY ROM: full bilateral hip ROM   LOWER EXTREMITY FFU:apojuzmjo hip strength is 5/5  PALPATION:   Pelvic Alignment: ASIS are in correct alignment  Abdominal: has a large mesh on the abdomen from hernia surgery  Diastasis: No Distortion: No  Breathing: lifts lower rib cage with breath, difficulty with opening the lower rib  cage Scar tissue: Yes: good mobility of the scar                External Perineal Exam: increased ridges around the rectum                             Internal  Pelvic Floor: puborectalis did not come forward very well but after manual work in moved forward  Patient confirms identification and approves PT to assess internal pelvic floor and treatment Yes No emotional/communication barriers or cognitive limitation. Patient is motivated to learn. Patient understands and agrees with treatment goals and plan. PT explains patient will be examined in standing, sitting, and lying down to see how their muscles and joints work. When they are ready, they will be asked to remove their underwear so PT can examine their perineum. The patient is also given the option of providing their own chaperone as one is not provided in our facility. The patient also has the right and is explained the right to defer or refuse any part of the evaluation or treatment including the internal exam. With the patient's consent, PT will use one gloved finger to gently assess the muscles of the pelvic floor, seeing how well it contracts and relaxes and if there is muscle symmetry. After, the patient will get dressed and PT and patient will discuss exam findings and plan of care. PT and patient discuss plan of care, schedule, attendance policy and HEP activities.   PELVIC MMT:   MMT eval  Internal Anal Sphincter 2/5 but after manual work increased to 3/5  External Anal Sphincter 2/5 but after manual work increased to [employer] 2/5 but after manual work increased to 3/5  (Blank rows = not tested)        TONE: Average tone  PROLAPSE: none  TODAY'S TREATMENT:      03/05/24 Manual: Joint mobilization PA and rotational mobilization to L5 grade 3 Soft tissue mobilization: Circular massage of the abdomen to improve peristalic motion of the intestines Myofascial release: Fascial release of the lower abdomen to release  around the bladder and colon going through the layers of restrictions Neuromuscular re-education: Core retraining: Supine pulling band to side to work the obliques with ball squeeze 15 x each way pulling the yellow tube Supine with yoga block between knees and hips at 90/90 with hip internal rotation 5 x 3 Form correction: Quadruped with left knee on yoga block with cat camel to open up the left SI joint and lumbar sacral area Quadruped with left knee on yoga block with moving hips to the left knee to open up the left SI joint and lumbar sacral area    02/22/24 Neuromuscular re-education: Core retraining: Supine pulling band to side to work the obliques with ball squeeze 15 x each way Sup[ine pulling band down  to hips with ball squeeze 15 x Supine press ball into the same side knee and flex the other hip and shoulder 15 x each Bridge 15 x  Side plank 10 x each side Laying on side and press hand into mat and contract the abdominals Quadruped lift opposite arm and leg    02/15/24 Manual: Soft tissue mobilization: Circular massage to the abdomen to promote peristalic motion of the intestines Manual work to the diaphragm Scar tissue mobilization: Manual work on lower abdominal scar to reduce the restrictions Myofascial release: Fascial release of the lower quadrants Neuromuscular re-education: Core retraining: Supine hip flexion isometric to engage the lower abdomen 10 x  Supine diagonal hip flexion isometric to engage the obliques 10 x  Standing bilateral shoulder extension with red band 15 x Standing shoulder horizontal abduction with red band 15 x Diaphragmatic breathing in supine and sitting Therapeutic activities: Functional strengthening activities: Educated patient on how to sit on the commode to  have a bowel movement, diaphragmatic breathing to relax the pelvic floor, and how to breath out to push stool out       PATIENT EDUCATION:  02/21/14 Education details: Access  Code: [email or web address]; how to have a bowel movement Person educated: Patient Education method: Explanation, Demonstration, Tactile cues, Verbal cues, and Handouts Education comprehension: verbalized understanding, returned demonstration, verbal cues required, tactile cues required, and needs further education  HOME EXERCISE PROGRAM: 02/22/24 Access Code: [email or web address] URL: [email or web address] Date: 02/22/2024 Prepared by: Channing Pereyra  Exercises - Seated Pelvic Floor Contraction  - 3 x daily - 7 x weekly - 1 sets - 5 reps - 10 sec hold - Seated Quick Flick Pelvic Floor Contractions  - 3 x daily - 7 x weekly - 1 sets - 10 reps - Shoulder extension with resistance - Neutral  - 1 x daily - 3 x weekly - 2 sets - 10 reps - Standing Shoulder Horizontal Abduction with Anchored Resistance  - 1 x daily - 3 x weekly - 1-2 sets - 10 reps - Supine Bridge  - 1 x daily - 3 x weekly - 1 sets - 15 reps - Hooklying Anti-Rotation Press With Anchored Resistance  - 1 x daily - 7 x weekly - 3 sets - 10 reps - Dead Bug with Swiss Ball  - 1 x daily - 3 x weekly - 3 sets - 10 reps - Side Plank on Knees  - 1 x daily - 3 x weekly - 1 sets - 10 reps - Supine 90/90 Shoulder Extension with Resistance  - 1 x daily - 3 x weekly - 1 sets - 10 reps - Quadruped Pelvic Floor Contraction with Opposite Arm and Leg Lift  - 1 x daily - 3 x weekly - 1 sets - 10 reps  Patient Education - Abdominal Massage for Constipation ASSESSMENT:  CLINICAL IMPRESSION: Patient is a 48 y.o. female who was seen today for physical therapy  treatment for abdominal bloating and IBS. Patient leaked a little with a sneeze. Patient has a bowel movement daily. Patient may have urgency with bowel movements but has improved by 15 %. Patient is using the technique to have a bowel movement and it is helping.  Patient is able to engage the obliques to reduce the rib cage angle. Patient had some pain in the left SI area when laying on her back but the  SI joint mobilization exercises reduced the pain. Patient will benefit from skilled therapy to work on pelvic floor coordination to assist with pushing stool out.   OBJECTIVE IMPAIRMENTS: decreased activity tolerance, decreased coordination, decreased strength, and pain.   ACTIVITY LIMITATIONS: toileting  PARTICIPATION LIMITATIONS: occupation  PERSONAL FACTORS: Time since onset of injury/illness/exacerbation are also affecting patient's functional outcome.   REHAB POTENTIAL: Excellent  CLINICAL DECISION MAKING: Evolving/moderate complexity  EVALUATION COMPLEXITY: Moderate   GOALS: Goals reviewed with patient? Yes  SHORT TERM GOALS: Target date: 03/07/24  Patient instructed on sitting on commode to have a bowel movement.  Baseline: Goal status: Met 02/22/24  2.  Patient is able to demonstrate diaphragmatic breathing to assist with pushing stool out.  Baseline:  Goal status: Met 02/15/24  3.  Patient educated on abdominal massage to assist with moving the stool through the intestines.  Baseline:  Goal status: Met 02/15/24   LONG TERM GOALS: Target date: 08/08/24  Patient independent with advanced HEP for core and pelvic floor.  Baseline:  Goal status: INITIAL  2.  Patient is able to  have a bowel movement every 3 days or less due to the improved coordination of the pelvic floor and using the abdominal massage.  Baseline:  Goal status: INITIAL  3.  Patient is able to push the therapist finger out of the rectum so she is able to push stool out of rectum without pressing on the right side of the rectum.  Baseline:  Goal status: INITIAL   PLAN:  PT FREQUENCY: 1x/week  PT DURATION: 6 months  PLANNED INTERVENTIONS: 97110-Therapeutic exercises, 97530- Therapeutic activity, 97112- Neuromuscular re-education, 97535- Self Care, [id number]- Manual therapy, Patient/Family education, Spinal mobilization, and Biofeedback  PLAN FOR NEXT SESSION:  continue with core and postural muscle  strength; see about rectal work and with pushing the therapist finger out of the rectum  Channing Pereyra, PT 03/05/24 3:04 PM

## 2024-03-12 ENCOUNTER — Encounter: Payer: Self-pay | Attending: Physician Assistant | Admitting: Physical Therapy

## 2024-03-12 ENCOUNTER — Encounter: Payer: Self-pay | Admitting: Physical Therapy

## 2024-03-12 DIAGNOSIS — R278 Other lack of coordination: Secondary | ICD-10-CM | POA: Insufficient documentation

## 2024-03-12 DIAGNOSIS — M6281 Muscle weakness (generalized): Secondary | ICD-10-CM | POA: Insufficient documentation

## 2024-03-12 DIAGNOSIS — R293 Abnormal posture: Secondary | ICD-10-CM | POA: Diagnosis present

## 2024-03-12 NOTE — Therapy (Addendum)
 OUTPATIENT PHYSICAL THERAPY FEMALE PELVIC TREATMENT   Patient Name: Alison [name] MRN: [id number] DOB:[birth year], 48 y.o., female Today's Date: 03/12/2024  END OF SESSION:  PT End of Session - 03/12/24 1600     Visit Number 5    Date for Recertification  08/08/24    Authorization Type BC[employer]    PT Start Time 1600    PT Stop Time 1645    PT Time Calculation (min) 45 min    Activity Tolerance Patient tolerated treatment well          Past Medical History:  Diagnosis Date   Anxiety    Bacterial overgrowth syndrome 05/09/2010   Bloating 05/09/2010   SEP 2012 HBT C/W SIBO(1ST pk 15, 2nd pk 21)   GERD (gastroesophageal reflux disease)    Hypertension    Pneumonia    PONV (postoperative nausea and vomiting)    Ulcer 12/03/2010   Past Surgical History:  Procedure Laterality Date   CESAREAN SECTION  X2   CHOLECYSTECTOMY  APR 2012 [facility]   BILIARY DYSKINESIA, path: CHRONIC CHOLECYSTITIS   DILATATION AND CURETTAGE/HYSTEROSCOPY WITH MINERVA N/A 08/04/2021   Procedure: DILATATION AND CURETTAGE/HYSTEROSCOPY WITH MINERVA;  Surgeon: Jayne Vonn DEL, MD;  Location: [facility];  Service: Gynecology;  Laterality: N/A;   ESOPHAGOGASTRODUODENOSCOPY  12/03/2010   Fields: PUD, Bx NEGATIVE for H pylori   ESOPHAGOGASTRODUODENOSCOPY  03/15/2011   Fields: mild gastritis, PUD resolved   HYDROGEN BREATH TEST  01/12/2011   Positive SBBO   HYSTERECTOMY ABDOMINAL WITH SALPINGECTOMY Bilateral 06/15/2023   Procedure: HYSTERECTOMY ABDOMINAL WITH SALPINGECTOMY;  Surgeon: Lequita Evalene LABOR, MD;  Location: [facility];  Service: Gynecology;  Laterality: Bilateral;   INCISIONAL HERNIA REPAIR  12/13/2010   Procedure: HERNIA REPAIR INCISIONAL;  Surgeon: Oneil LABOR Budge;  Location: [facility];  Service: General;  Laterality: N/A;  with mesh   INCISIONAL HERNIA REPAIR  01/24/2011   Procedure: HERNIA REPAIR INCISIONAL;  Surgeon: Oneil LABOR Budge;  Location: [facility];  Service: General;  Laterality: N/A;  Recurrent Incisional  Herniorraphy with Mesh   INCISIONAL HERNIA REPAIR  2013   [facility]   TUBAL LIGATION     Patient Active Problem List   Diagnosis Date Noted   Pelvic pain 06/15/2023   Elevated BP without diagnosis of hypertension 07/02/2018   Abdominal bloating with cramps 07/02/2018   Irregular intermenstrual bleeding 07/02/2018   Menorrhagia with regular cycle 07/02/2018   Rectocele 07/02/2018   Encounter for well woman exam with routine gynecological exam 07/02/2018   Screening for colorectal cancer 07/02/2018   Constipation 05/07/2014   PUD (peptic ulcer disease) 01/12/2011   Bloating 11/16/2010   Diarrhea 05/22/2010    PCP: Dow Longs, PA-C  REFERRING PROVIDER: Craig Alan SAUNDERS, PA-C   REFERRING DIAG:  M85.9,M89.0 (ICD-10-CM) - Abdominal bloating with cramps  K58.0 (ICD-10-CM) - Irritable bowel syndrome with diarrhea    THERAPY DIAG:  Muscle weakness (generalized)  Other lack of coordination  Abnormal posture  Rationale for Evaluation and Treatment: Rehabilitation  ONSET DATE: 06/2023  SUBJECTIVE:  SUBJECTIVE STATEMENT: I have been doing the exercises. I am feeling stronger.  Fluid intake: diet pepsi, water   FUNCTIONAL LIMITATIONS: none  PERTINENT HISTORY:  Medications for current condition: none Surgeries: Endometrial ablation 07/08/21; Partial hysterectomy 06/2023; incistional hernia repair 12/2010;  Other: See above.  Sexual abuse: No  PAIN:  Are you having pain? Yes NPRS scale: 3/10 Pain location: low back  Pain type: aching and dull Pain description: intermittent   Aggravating factors: sit long Relieving factors: heating pad  PRECAUTIONS: None  RED FLAGS: None   WEIGHT BEARING RESTRICTIONS: No  FALLS:  Has patient fallen in last 6 months? No  OCCUPATION: sitting  job  ACTIVITY LEVEL : sedentary  PLOF: Independent  PATIENT GOALS: help with bowel problem   BOWEL MOVEMENT: Pain with bowel movement: Yes, 6/10 when constipated Type of bowel movement:Type (Bristol Stool Scale) Type 3 when constipated, Frequency 8-10 per week, Strain sometimes, and Splinting press on the right side of the rectum Fully empty rectum: No, not all the time Leakage: Yes: when she has had diarrhea                                                      Fiber supplement/laxative takes Miralax nightly  URINATION: Pain with urination: No Fully empty bladder: Yes:                               Stream: Strong Urgency: No Frequency:during the day 8                                                         Nocturia: No0-1   Leakage: Walking to the bathroom when she held her urine too long Pads/briefs: No  INTERCOURSE:  Ability to have vaginal penetration Yes  Pain with intercourse: none  PREGNANCY: C-section deliveries 2 Currently pregnant No  PROLAPSE: None   OBJECTIVE:  Note: Objective measures were completed at Evaluation unless otherwise noted.  DIAGNOSTIC FINDINGS:  none   COGNITION: Overall cognitive status: Within functional limits for tasks assessed     SENSATION: Light touch:     POSTURE: rounded shoulders, forward head, and decreased lumbar lordosis   LUMBARAROM/PROM:  A/PROM A/PROM  Eval (% available)  Flexion   Extension   Right lateral flexion   Left lateral flexion   Right rotation   Left rotation    (Blank rows = not tested)  LOWER EXTREMITY ROM: full bilateral hip ROM   LOWER EXTREMITY FFU:apojuzmjo hip strength is 5/5  PALPATION:   Pelvic Alignment: ASIS are in correct alignment  Abdominal: has a large mesh on the abdomen from hernia surgery  Diastasis: No Distortion: No  Breathing: lifts lower rib cage with breath, difficulty with opening the lower rib cage Scar tissue: Yes: good mobility of the scar                 External Perineal Exam: increased ridges around the rectum                             Internal  Pelvic Floor: puborectalis did not come forward very well but after manual work in moved forward  Patient confirms identification and approves PT to assess internal pelvic floor and treatment Yes No emotional/communication barriers or cognitive limitation. Patient is motivated to learn. Patient understands and agrees with treatment goals and plan. PT explains patient will be examined in standing, sitting, and lying down to see how their muscles and joints work. When they are ready, they will be asked to remove their underwear so PT can examine their perineum. The patient is also given the option of providing their own chaperone as one is not provided in our facility. The patient also has the right and is explained the right to defer or refuse any part of the evaluation or treatment including the internal exam. With the patient's consent, PT will use one gloved finger to gently assess the muscles of the pelvic floor, seeing how well it contracts and relaxes and if there is muscle symmetry. After, the patient will get dressed and PT and patient will discuss exam findings and plan of care. PT and patient discuss plan of care, schedule, attendance policy and HEP activities.   PELVIC MMT:   MMT eval 03/12/24  Internal Anal Sphincter 2/5 but after manual work increased to 3/5 3/5  External Anal Sphincter 2/5 but after manual work increased to 3/5 3/5  Puborectalis 2/5 but after manual work increased to 3/5 3/5  (Blank rows = not tested)        TONE: Average tone  PROLAPSE: none  TODAY'S TREATMENT:      03/12/24 Manual: Spinal mobilization: PA and rotational mobilization to T10 - L5 grade 3 Right rib from 48 yo 10 mobilization to improve mobility Internal pelvic floor techniques: No emotional/communication barriers or cognitive limitation. Patient is motivated to learn. Patient understands and agrees  with treatment goals and plan. PT explains patient will be examined in standing, sitting, and lying down to see how their muscles and joints work. When they are ready, they will be asked to remove their underwear so PT can examine their perineum. The patient is also given the option of providing their own chaperone as one is not provided in our facility. The patient also has the right and is explained the right to defer or refuse any part of the evaluation or treatment including the internal exam. With the patient's consent, PT will use one gloved finger to gently assess the muscles of the pelvic floor, seeing how well it contracts and relaxes and if there is muscle symmetry. After, the patient will get dressed and PT and patient will discuss exam findings and plan of care. PT and patient discuss plan of care, schedule, attendance policy and HEP activities.  Therapist gloved finger in the rectum working on the anococcygeal ligament to elongate for the puborectalis to come forward Neuromuscular re-education: Pelvic floor contraction training: Therapist gloved finger in the rectum working on contraction of the muscles without contracting the gluteal, holding 15 sec and quick flicks Therapist gloved finger in the rectum working on patient pushing therapist finger out of the rectum with breathing out and generating a force. It took many trials for her to feel like she was able to do this Exercises: Stretches/mobility: Prone press up 10 x for lumbar extension Karolynn pose hold for 30 sec.  Strengthening: Prone bilateral shoulder for Y movement Prone bilateral shoulder T movement    03/05/24 Manual: Joint mobilization PA and rotational mobilization to L5 grade 3 Soft tissue mobilization:  Circular massage of the abdomen to improve peristalic motion of the intestines Myofascial release: Fascial release of the lower abdomen to release around the bladder and colon going through the layers of  restrictions Neuromuscular re-education: Core retraining: Supine pulling band to side to work the obliques with ball squeeze 15 x each way pulling the yellow tube Supine with yoga block between knees and hips at 90/90 with hip internal rotation 5 x 3 Form correction: Quadruped with left knee on yoga block with cat camel to open up the left SI joint and lumbar sacral area Quadruped with left knee on yoga block with moving hips to the left knee to open up the left SI joint and lumbar sacral area    02/22/24 Neuromuscular re-education: Core retraining: Supine pulling band to side to work the obliques with ball squeeze 15 x each way Sup[ine pulling band down  to hips with ball squeeze 15 x Supine press ball into the same side knee and flex the other hip and shoulder 15 x each Bridge 15 x  Side plank 10 x each side Laying on side and press hand into mat and contract the abdominals Quadruped lift opposite arm and leg       PATIENT EDUCATION:  02/21/14 Education details: Access Code: [email or web address]; how to have a bowel movement Person educated: Patient Education method: Explanation, Demonstration, Tactile cues, Verbal cues, and Handouts Education comprehension: verbalized understanding, returned demonstration, verbal cues required, tactile cues required, and needs further education  HOME EXERCISE PROGRAM: 02/22/24 Access Code: [email or web address] URL: [email or web address] Date: 02/22/2024 Prepared by: Channing Pereyra  Exercises - Seated Pelvic Floor Contraction  - 3 x daily - 7 x weekly - 1 sets - 5 reps - 10 sec hold - Seated Quick Flick Pelvic Floor Contractions  - 3 x daily - 7 x weekly - 1 sets - 10 reps - Shoulder extension with resistance - Neutral  - 1 x daily - 3 x weekly - 2 sets - 10 reps - Standing Shoulder Horizontal Abduction with Anchored Resistance  - 1 x daily - 3 x weekly - 1-2 sets - 10 reps - Supine Bridge  - 1 x daily - 3 x weekly - 1 sets - 15 reps - Hooklying  Anti-Rotation Press With Anchored Resistance  - 1 x daily - 7 x weekly - 3 sets - 10 reps - Dead Bug with Swiss Ball  - 1 x daily - 3 x weekly - 3 sets - 10 reps - Side Plank on Knees  - 1 x daily - 3 x weekly - 1 sets - 10 reps - Supine 90/90 Shoulder Extension with Resistance  - 1 x daily - 3 x weekly - 1 sets - 10 reps - Quadruped Pelvic Floor Contraction with Opposite Arm and Leg Lift  - 1 x daily - 3 x weekly - 1 sets - 10 reps  Patient Education - Abdominal Massage for Constipation ASSESSMENT:  CLINICAL IMPRESSION: Patient is a 49 y.o. female who was seen today for physical therapy  treatment for abdominal bloating and IBS. Patient leaked a little with a sneeze. Patient has a bowel movement daily. Patient may have urgency with bowel movements but has improved by 100 %.  No urinary leakage unless she has an extreme gag reflex. She is able to contract the pelvic floro at 3/5 for 15 seconds. She was able to push the therapist finger out by the end but took many repetitions. Patient is starting to engage the lower abdominals  correctly. Patient will benefit from skilled therapy to work on pelvic floor coordination to assist with pushing stool out.   OBJECTIVE IMPAIRMENTS: decreased activity tolerance, decreased coordination, decreased strength, and pain.   ACTIVITY LIMITATIONS: toileting  PARTICIPATION LIMITATIONS: occupation  PERSONAL FACTORS: Time since onset of injury/illness/exacerbation are also affecting patient's functional outcome.   REHAB POTENTIAL: Excellent  CLINICAL DECISION MAKING: Evolving/moderate complexity  EVALUATION COMPLEXITY: Moderate   GOALS: Goals reviewed with patient? Yes  SHORT TERM GOALS: Target date: 03/07/24  Patient instructed on sitting on commode to have a bowel movement.  Baseline: Goal status: Met 02/22/24  2.  Patient is able to demonstrate diaphragmatic breathing to assist with pushing stool out.  Baseline:  Goal status: Met 02/15/24  3.   Patient educated on abdominal massage to assist with moving the stool through the intestines.  Baseline:  Goal status: Met 02/15/24   LONG TERM GOALS: Target date: 08/08/24  Patient independent with advanced HEP for core and pelvic floor.  Baseline:  Goal status: INITIAL  2.  Patient is able to have a bowel movement every 3 days or less due to the improved coordination of the pelvic floor and using the abdominal massage.  Baseline: only if she takes her miralx Goal status: INITIAL  3.  Patient is able to push the therapist finger out of the rectum so she is able to push stool out of rectum without pressing on the right side of the rectum.  Baseline:  Goal status: INITIAL   PLAN:  PT FREQUENCY: 1x/week  PT DURATION: 6 months  PLANNED INTERVENTIONS: 97110-Therapeutic exercises, 97530- Therapeutic activity, 97112- Neuromuscular re-education, 97535- Self Care, 02859- Manual therapy, Patient/Family education, Spinal mobilization, and Biofeedback  PLAN FOR NEXT SESSION:  continue with core and postural muscle strength  Channing Pereyra, PT 03/12/24 4:54 PM  PHYSICAL THERAPY DISCHARGE SUMMARY  Visits from Start of Care: 5  Current functional level related to goals / functional outcomes: See above. Patient has not returned since her last visit on 03/12/24. She has no-showed for her last 2 visits.    Remaining deficits: See above.    Education / Equipment: HEP   Patient agrees to discharge. Patient goals were not met. Patient is being discharged due to not returning since the last visit. Thank you for the referral.   Channing Pereyra, PT 04/23/2024 11:04 AM

## 2024-03-19 ENCOUNTER — Encounter: Payer: Self-pay | Admitting: Physical Therapy

## 2024-03-25 ENCOUNTER — Encounter: Payer: Self-pay | Admitting: Physical Therapy

## 2024-03-26 ENCOUNTER — Ambulatory Visit: Payer: Self-pay | Admitting: Physician Assistant

## 2024-03-26 ENCOUNTER — Ambulatory Visit (INDEPENDENT_AMBULATORY_CARE_PROVIDER_SITE_OTHER)
Admission: RE | Admit: 2024-03-26 | Discharge: 2024-03-26 | Disposition: A | Source: Ambulatory Visit | Attending: Physician Assistant | Admitting: Physician Assistant

## 2024-03-26 ENCOUNTER — Encounter: Payer: Self-pay | Admitting: Physical Therapy

## 2024-03-26 DIAGNOSIS — R109 Unspecified abdominal pain: Secondary | ICD-10-CM

## 2024-03-26 DIAGNOSIS — R1084 Generalized abdominal pain: Secondary | ICD-10-CM

## 2024-03-26 DIAGNOSIS — R140 Abdominal distension (gaseous): Secondary | ICD-10-CM

## 2024-03-26 MED ORDER — RIFAXIMIN 550 MG PO TABS: 550.0000 mg | ORAL_TABLET | Freq: Three times a day (TID) | ORAL | 0 refills | Status: AC

## 2024-04-02 ENCOUNTER — Encounter: Payer: Self-pay | Admitting: Physical Therapy

## 2024-04-16 ENCOUNTER — Encounter: Payer: Self-pay | Attending: Physician Assistant | Admitting: Physical Therapy

## 2024-04-16 ENCOUNTER — Telehealth: Payer: Self-pay | Admitting: Physical Therapy

## 2024-04-16 DIAGNOSIS — M6281 Muscle weakness (generalized): Secondary | ICD-10-CM | POA: Insufficient documentation

## 2024-04-16 DIAGNOSIS — R293 Abnormal posture: Secondary | ICD-10-CM | POA: Insufficient documentation

## 2024-04-16 DIAGNOSIS — R278 Other lack of coordination: Secondary | ICD-10-CM | POA: Insufficient documentation

## 2024-04-16 NOTE — Telephone Encounter (Signed)
 Called patient about her missed appointment today at 11:39. Left a message. Channing Pereyra, PT @12 /9/25@ 1:01 PM

## 2024-04-23 ENCOUNTER — Encounter: Admitting: Physical Therapy

## 2024-04-23 ENCOUNTER — Telehealth: Payer: Self-pay | Admitting: Physical Therapy

## 2024-04-23 NOTE — Telephone Encounter (Signed)
 Called patient about her missed appointment today at 10:30. Left a message.  Channing Pereyra, PT @12 /16/25@ 11:01 AM

## 2024-04-24 MED ORDER — NEOMYCIN SULFATE 500 MG PO TABS: 500.0000 mg | ORAL_TABLET | Freq: Two times a day (BID) | ORAL | 0 refills | Status: AC

## 2024-04-24 MED ORDER — RIFAXIMIN 550 MG PO TABS: 550.0000 mg | ORAL_TABLET | Freq: Three times a day (TID) | ORAL | 0 refills | Status: AC

## 2024-04-24 NOTE — Telephone Encounter (Signed)
 Patient is willing to try another round of Xifaxin after I explained it can sometime take several rounds. She has scheduled an OV  in early January. May I share info with her about low FODMAP? Thanks

## 2024-05-10 NOTE — Progress Notes (Deleted)
 "    05/10/2024 Alison [name] [id number] 1976/05/03  Referring provider: Dow Longs, PA-C Primary GI doctor: Dr. Charlanne  ASSESSMENT AND PLAN:   GERD with history of peptic ulcer disease 2012 secondary to NSAIDs 11/29/2022 EGD for reflux showed 3 cm hiatal hernia erythematous mucosa in the stomach normal duodenum Path showed non-H. pylori gastritis and negative celiac Lifestyle changes discussed, avoid NSAIDS, ETOH - well controlled at this time  IBS- diarrhea, previously laxative dependent worse status post cholecystectomy 12/2012 positive hydrogen breath test antibiotics without much help 11/03/2023 [facility] W with postop changes no acute abdominal or pelvic pathology aortic atherosclerosis normal liver, pancreas, bile ducts, spleen, stomach and bowel 03/26/2024 KUB no significant stool burden Patient has been laxative dependent, failed MiraLAX, fiber, stool softeners, linzess  - Given Linzess  however cannot tolerate has been on Trulance  however continues to have to take laxative with alternating constipation/blow out diarrhea with bloating, nausea.  - add on benefiber  - will refer to pelvic floor PT, consider anal rectal manometry  Personal history of colon polyps 11/29/2022 colonoscopy for screening adequate bowel prep.  No skin tags, hemorrhoids with 3 polyps 3 to 7 mm rectosigmoid colon normal mucosa nonbleeding external and internal hemorrhoids TA polyps and hyperplastic polyp recall 7 years  History of dysmenorrhea/rectocele/pelvic floor dysfunction Has 2 kids and 2 grandkids History of endometrial ablation 07/29/2021 Partial hysterectomy 06/2023  Patient Care Team: Dow Longs, PA-C as PCP - General (Family Medicine) Harvey Margo CROME, MD (Inactive) (Gastroenterology)  HISTORY OF PRESENT ILLNESS: 49 y.o. female with a past medical history of peptic ulcer disease, alternating diarrhea constipation, SIBO, and others listed below presents for evaluation of GERD,  constipation/diarrhea/AB pain.   I last saw the patient in the office 10/18/2023 for generalized abdominal pain, IBS with diarrhea and GERD. Patient previously seen at [facility].  Discussed the use of AI scribe software for clinical note transcription with the patient, who gave verbal consent to proceed.  History of Present Illness           She  reports that she has quit smoking. Her smoking use included cigarettes. She has a 5 pack-year smoking history. She has never used smokeless tobacco. She reports that she does not drink alcohol and does not use drugs.  RELEVANT LABS AND IMAGING: CBC    Component Value Date/Time   WBC 5.7 10/18/2023 0911   RBC 4.69 10/18/2023 0911   HGB 14.8 10/18/2023 0911   HCT 44.3 10/18/2023 0911   PLT 297 10/18/2023 0911   MCV 94.5 10/18/2023 0911   MCH 31.6 10/18/2023 0911   MCHC 33.4 10/18/2023 0911   RDW 13.5 10/18/2023 0911   LYMPHSABS 1.4 10/12/2022 0931   MONOABS 0.5 10/12/2022 0931   EOSABS 108 10/18/2023 0911   BASOSABS 57 10/18/2023 0911   Recent Labs    06/12/23 0932 06/16/23 0358 06/16/23 1744 10/18/23 0911  HGB 13.7 11.2* 11.2* 14.8    CMP     Component Value Date/Time   NA 138 10/18/2023 0911   K 3.7 10/18/2023 0911   CL 104 10/18/2023 0911   CO2 25 10/18/2023 0911   GLUCOSE 79 10/18/2023 0911   BUN 9 10/18/2023 0911   CREATININE 0.75 10/18/2023 0911   CALCIUM  10.1 10/18/2023 0911   PROT 7.3 10/18/2023 0911   ALBUMIN 4.6 10/12/2022 0931   AST 25 10/18/2023 0911   ALT 27 10/18/2023 0911   ALKPHOS 63 10/12/2022 0931   BILITOT 0.4 10/18/2023 0911   GFRNONAA >60  06/12/2023 0932   GFRAA >60 01/19/2011 1033      Latest Ref Rng & Units 10/18/2023    9:11 AM 10/12/2022    9:31 AM 10/13/2021   11:22 PM  Hepatic Function  Total Protein 6.1 - 8.1 g/dL 7.3  7.5  6.4   Albumin 3.5 - 5.2 g/dL  4.6  3.8   AST 10 - 35 U/L 25  16  23    ALT 6 - 29 U/L 27  15  14    Alk Phosphatase 39 - 117 U/L  63  72   Total Bilirubin 0.2 -  1.2 mg/dL 0.4  0.4  0.3       Current Medications:   Current Outpatient Medications (Cardiovascular):    hydrochlorothiazide  (HYDRODIURIL ) 12.5 MG tablet, Take 12.5 mg by mouth daily.   rosuvastatin  (CRESTOR ) 5 MG tablet, Take 5 mg by mouth daily.   valsartan (DIOVAN) 40 MG tablet, Take 40 mg by mouth daily.  Current Outpatient Medications (Respiratory):    fexofenadine (ALLEGRA) 180 MG tablet, Take 180 mg by mouth daily.  Current Outpatient Medications (Analgesics):    acetaminophen  (TYLENOL ) 325 MG tablet, Take 2 tablets (650 mg total) by mouth every 4 (four) hours as needed for mild pain (pain score 1-3) (temperature > 101.5.).   ibuprofen  (ADVIL ) 600 MG tablet, Take 1 tablet (600 mg total) by mouth every 6 (six) hours.   oxyCODONE  (OXY IR/ROXICODONE ) 5 MG immediate release tablet, Take 1 tablet (5 mg total) by mouth every 4 (four) hours as needed for moderate pain (pain score 4-6).  Current Outpatient Medications (Other):    Cholecalciferol (VITAMIN D3 PO), Take 1 tablet by mouth daily.   clonazePAM  (KLONOPIN ) 0.5 MG tablet, Take 0.5 mg by mouth at bedtime as needed (sleep).   neomycin  (MYCIFRADIN ) 500 MG tablet, Take 1 tablet (500 mg total) by mouth 2 (two) times daily.   omeprazole  (PRILOSEC) 40 MG capsule, Take 1 capsule (40 mg total) by mouth daily.   ondansetron  (ZOFRAN ) 4 MG tablet, Take 1 tablet (4 mg total) by mouth every 6 (six) hours as needed for nausea.   PARoxetine  (PAXIL ) 40 MG tablet, Take 40 mg by mouth daily.   Plecanatide  (TRULANCE ) 3 MG TABS, Take 1 tablet (3 mg total) by mouth daily.   senna (SENOKOT) 8.6 MG TABS tablet, Take 1 tablet (8.6 mg total) by mouth daily.  Medical History:  Past Medical History:  Diagnosis Date   Anxiety    Bacterial overgrowth syndrome 05/09/2010   Bloating 05/09/2010   SEP 2012 HBT C/W SIBO(1ST pk 15, 2nd pk 21)   GERD (gastroesophageal reflux disease)    Hypertension    Pneumonia    PONV (postoperative nausea and vomiting)     Ulcer 12/03/2010   Allergies:  Allergies  Allergen Reactions   Prednisone Nausea And Vomiting   Doxycycline  Nausea And Vomiting     Surgical History:  She  has a past surgical history that includes Cesarean section (X2); Cholecystectomy (APR 2012 [facility]); Tubal ligation; Esophagogastroduodenoscopy (12/03/2010); Incisional hernia repair (12/13/2010); hydrogen breath test (01/12/2011); Incisional hernia repair (01/24/2011); Esophagogastroduodenoscopy (03/15/2011); Dilatation and curettage/hysteroscopy with minerva (N/A, 08/04/2021); Incisional hernia repair (2013); and Hysterectomy abdominal with salpingectomy (Bilateral, 06/15/2023). Family History:  Her family history includes Congenital adrenal hyperplasia in her son; Diabetes in her maternal grandfather; Thalassemia in her father.  REVIEW OF SYSTEMS  : All other systems reviewed and negative except where noted in the History of Present Illness.  PHYSICAL EXAM: LMP 07/09/2021  General Appearance: Well nourished, in no apparent distress. Head:   Normocephalic and atraumatic. Eyes:  sclerae anicteric,conjunctive pink  Respiratory: Respiratory effort normal, BS equal bilaterally without rales, rhonchi, wheezing. Cardio: RRR with no MRGs. Peripheral pulses intact.  Abdomen: Soft,  Obese ,active bowel sounds. mild tenderness in the lower abdomen. Without guarding and Without rebound. No masses. Rectal: Not evaluated Musculoskeletal: Full ROM, Normal gait. Without edema. Skin:  Dry and intact without significant lesions or rashes Neuro: Alert and  oriented x4;  No focal deficits. Psych:  Cooperative. Normal mood and affect.    Alan JONELLE Coombs, PA-C 8:54 AM   "

## 2024-05-13 ENCOUNTER — Ambulatory Visit: Admitting: Physician Assistant

## 2024-05-14 ENCOUNTER — Encounter: Admitting: Physical Therapy

## 2024-05-21 ENCOUNTER — Encounter: Admitting: Physical Therapy

## 2024-05-28 ENCOUNTER — Encounter: Admitting: Physical Therapy

## 2024-06-04 ENCOUNTER — Encounter: Admitting: Physical Therapy

## 2024-06-04 NOTE — Progress Notes (Unsigned)
 "    Referring-Erin Dow, PA-C Reason for referral-chest pain  HPI: 49 year old female for evaluation of chest pain at request of Rocky Dow, PA-C.  Echocardiogram June 2024 showed normal LV function.  Exercise treadmill in January 2026 without ischemic changes.  Cardiology now asked to evaluate.  Current Outpatient Medications  Medication Sig Dispense Refill   acetaminophen  (TYLENOL ) 325 MG tablet Take 2 tablets (650 mg total) by mouth every 4 (four) hours as needed for mild pain (pain score 1-3) (temperature > 101.5.). 30 tablet 0   Cholecalciferol (VITAMIN D3 PO) Take 1 tablet by mouth daily.     clonazePAM  (KLONOPIN ) 0.5 MG tablet Take 0.5 mg by mouth at bedtime as needed (sleep).     fexofenadine (ALLEGRA) 180 MG tablet Take 180 mg by mouth daily.     hydrochlorothiazide  (HYDRODIURIL ) 12.5 MG tablet Take 12.5 mg by mouth daily.     ibuprofen  (ADVIL ) 600 MG tablet Take 1 tablet (600 mg total) by mouth every 6 (six) hours. 30 tablet 0   neomycin  (MYCIFRADIN ) 500 MG tablet Take 1 tablet (500 mg total) by mouth 2 (two) times daily. 14 tablet 0   omeprazole  (PRILOSEC) 40 MG capsule Take 1 capsule (40 mg total) by mouth daily. 90 capsule 0   ondansetron  (ZOFRAN ) 4 MG tablet Take 1 tablet (4 mg total) by mouth every 6 (six) hours as needed for nausea. 20 tablet 0   oxyCODONE  (OXY IR/ROXICODONE ) 5 MG immediate release tablet Take 1 tablet (5 mg total) by mouth every 4 (four) hours as needed for moderate pain (pain score 4-6). 16 tablet 0   PARoxetine  (PAXIL ) 40 MG tablet Take 40 mg by mouth daily.     Plecanatide  (TRULANCE ) 3 MG TABS Take 1 tablet (3 mg total) by mouth daily. 90 tablet 3   rosuvastatin  (CRESTOR ) 5 MG tablet Take 5 mg by mouth daily.     senna (SENOKOT) 8.6 MG TABS tablet Take 1 tablet (8.6 mg total) by mouth daily. 20 tablet 0   valsartan (DIOVAN) 40 MG tablet Take 40 mg by mouth daily.     No current facility-administered medications for this visit.     Allergies[1]   Past Medical History:  Diagnosis Date   Anxiety    Bacterial overgrowth syndrome 05/09/2010   Bloating 05/09/2010   SEP 2012 [facility] C/W SIBO(1ST pk 15, 2nd pk 21)   GERD (gastroesophageal reflux disease)    Hypertension    Pneumonia    PONV (postoperative nausea and vomiting)    Ulcer 12/03/2010    Past Surgical History:  Procedure Laterality Date   CESAREAN SECTION  X2   CHOLECYSTECTOMY  APR 2012 [facility]   BILIARY DYSKINESIA, path: CHRONIC CHOLECYSTITIS   DILATATION AND CURETTAGE/HYSTEROSCOPY WITH MINERVA N/A 08/04/2021   Procedure: DILATATION AND CURETTAGE/HYSTEROSCOPY WITH MINERVA;  Surgeon: Jayne Vonn DEL, MD;  Location: [facility];  Service: Gynecology;  Laterality: N/A;   ESOPHAGOGASTRODUODENOSCOPY  12/03/2010   Fields: PUD, Bx NEGATIVE for H pylori   ESOPHAGOGASTRODUODENOSCOPY  03/15/2011   Fields: mild gastritis, PUD resolved   HYDROGEN BREATH TEST  01/12/2011   Positive SBBO   HYSTERECTOMY ABDOMINAL WITH SALPINGECTOMY Bilateral 06/15/2023   Procedure: HYSTERECTOMY ABDOMINAL WITH SALPINGECTOMY;  Surgeon: Lequita Evalene LABOR, MD;  Location: [facility];  Service: Gynecology;  Laterality: Bilateral;   INCISIONAL HERNIA REPAIR  12/13/2010   Procedure: HERNIA REPAIR INCISIONAL;  Surgeon: Oneil LABOR Budge;  Location: [facility];  Service: General;  Laterality: N/A;  with mesh   INCISIONAL  HERNIA REPAIR  01/24/2011   Procedure: HERNIA REPAIR INCISIONAL;  Surgeon: Oneil DELENA Budge;  Location: [facility];  Service: General;  Laterality: N/A;  Recurrent Incisional Herniorraphy with Mesh   INCISIONAL HERNIA REPAIR  2013   [facility]   TUBAL LIGATION      Social History   Socioeconomic History   Marital status: Married    Spouse name: Not on file   Number of children: Not on file   Years of education: Not on file   Highest education level: Not on file  Occupational History   Not on file  Tobacco Use   Smoking status: Former    Current packs/day: 0.50    Average packs/day: 0.5  packs/day for 10.0 years (5.0 ttl pk-yrs)    Types: Cigarettes   Smokeless tobacco: Never   Tobacco comments:    Quit 04/2022, not currently smoking  Vaping Use   Vaping status: Never Used  Substance and Sexual Activity   Alcohol use: No   Drug use: No   Sexual activity: Yes    Birth control/protection: Surgical    Comment: tubal  Other Topics Concern   Not on file  Social History Narrative   Not on file   Social Drivers of Health   Tobacco Use: Medium Risk (03/12/2024)   Patient History    Smoking Tobacco Use: Former    Smokeless Tobacco Use: Never    Passive Exposure: Not on Actuary Strain: Low Risk (07/08/2021)   Overall Financial Resource Strain (CARDIA)    Difficulty of Paying Living Expenses: Not very hard  Food Insecurity: No Food Insecurity (06/15/2023)   Hunger Vital Sign    Worried About Running Out of Food in the Last Year: Never true    Ran Out of Food in the Last Year: Never true  Transportation Needs: No Transportation Needs (06/15/2023)   PRAPARE - Administrator, Civil Service (Medical): No    Lack of Transportation (Non-Medical): No  Physical Activity: Insufficiently Active (07/08/2021)   Exercise Vital Sign    Days of Exercise per Week: 1 day    Minutes of Exercise per Session: 10 min  Stress: No Stress Concern Present (07/08/2021)   [employer] of Occupational Health - Occupational Stress Questionnaire    Feeling of Stress : Only a little  Social Connections: Moderately Integrated (07/08/2021)   Social Connection and Isolation Panel    Frequency of Communication with Friends and Family: Three times a week    Frequency of Social Gatherings with Friends and Family: Once a week    Attends Religious Services: 1 to 4 times per year    Active Member of [employer] or Organizations: No    Attends Banker Meetings: Never    Marital Status: Married  Catering Manager Violence: Not At Risk (06/15/2023)   Humiliation, Afraid, Rape,  and Kick questionnaire    Fear of Current or Ex-Partner: No    Emotionally Abused: No    Physically Abused: No    Sexually Abused: No  Depression (PHQ2-9): Low Risk (07/08/2021)   Depression (PHQ2-9)    PHQ-2 Score: 4  Alcohol Screen: Low Risk (07/08/2021)   Alcohol Screen    Last Alcohol Screening Score (AUDIT): 0  Housing: Low Risk (06/15/2023)   Housing Stability Vital Sign    Unable to Pay for Housing in the Last Year: No    Number of Times Moved in the Last Year: 0    Homeless in the Last  Year: No  Utilities: Not At Risk (06/15/2023)   [facility] Utilities    Threatened with loss of utilities: No  Health Literacy: Not on file    Family History  Problem Relation Age of Onset   Thalassemia Father    Diabetes Maternal Grandfather    Congenital adrenal hyperplasia Son    Anesthesia problems Neg Hx    Hypotension Neg Hx    Malignant hyperthermia Neg Hx    Pseudochol deficiency Neg Hx    Colon cancer Neg Hx    Esophageal cancer Neg Hx    Rectal cancer Neg Hx    Stomach cancer Neg Hx     ROS: no fevers or chills, productive cough, hemoptysis, dysphasia, odynophagia, melena, hematochezia, dysuria, hematuria, rash, seizure activity, orthopnea, PND, pedal edema, claudication. Remaining systems are negative.  Physical Exam:   Last menstrual period 07/09/2021.  General:  Well developed/well nourished in NAD Skin warm/dry Patient not depressed No peripheral clubbing Back-normal HEENT-normal/normal eyelids Neck supple/normal carotid upstroke bilaterally; no bruits; no JVD; no thyromegaly chest - CTA/ normal expansion CV - RRR/normal S1 and S2; no murmurs, rubs or gallops;  PMI nondisplaced Abdomen -NT/ND, no HSM, no mass, + bowel sounds, no bruit 2+ femoral pulses, no bruits Ext-no edema, chords, 2+ DP Neuro-grossly nonfocal  ECG - personally reviewed  A/P  1 chest pain-  2 hypertension-  Redell Shallow, MD     [1]  Allergies Allergen Reactions   Prednisone Nausea And  Vomiting   Doxycycline  Nausea And Vomiting   "

## 2024-06-05 ENCOUNTER — Encounter: Payer: Self-pay | Admitting: Cardiology

## 2024-06-05 ENCOUNTER — Ambulatory Visit: Admitting: Cardiology

## 2024-06-05 VITALS — BP 120/84 | HR 83 | Ht 65.0 in | Wt 225.0 lb

## 2024-06-05 DIAGNOSIS — R072 Precordial pain: Secondary | ICD-10-CM | POA: Diagnosis not present

## 2024-06-05 DIAGNOSIS — R002 Palpitations: Secondary | ICD-10-CM | POA: Diagnosis not present

## 2024-06-05 DIAGNOSIS — E785 Hyperlipidemia, unspecified: Secondary | ICD-10-CM | POA: Diagnosis not present

## 2024-06-05 NOTE — Patient Instructions (Signed)
  Follow-Up: At [facility], you and your health needs are our priority.  As part of our continuing mission to provide you with exceptional heart care, our providers are all part of one team.  This team includes your primary Cardiologist (physician) and Advanced Practice Providers or APPs (Physician Assistants and Nurse Practitioners) who all work together to provide you with the care you need, when you need it.  Your next appointment:    As needed

## 2024-07-22 ENCOUNTER — Ambulatory Visit: Admitting: Cardiology
